# Patient Record
Sex: Male | Born: 1970 | Race: White | Hispanic: No | Marital: Married | Smoking: Never smoker
Health system: Southern US, Community
[De-identification: ages and names within clinical notes are randomized; demographics above are authoritative.]

## PROBLEM LIST (undated history)

## (undated) DIAGNOSIS — Z87442 Personal history of urinary calculi: Secondary | ICD-10-CM

## (undated) DIAGNOSIS — J189 Pneumonia, unspecified organism: Secondary | ICD-10-CM

## (undated) DIAGNOSIS — K219 Gastro-esophageal reflux disease without esophagitis: Secondary | ICD-10-CM

## (undated) DIAGNOSIS — R7303 Prediabetes: Secondary | ICD-10-CM

## (undated) DIAGNOSIS — E119 Type 2 diabetes mellitus without complications: Secondary | ICD-10-CM

## (undated) DIAGNOSIS — Z8701 Personal history of pneumonia (recurrent): Secondary | ICD-10-CM

## (undated) DIAGNOSIS — M109 Gout, unspecified: Secondary | ICD-10-CM

## (undated) DIAGNOSIS — I10 Essential (primary) hypertension: Secondary | ICD-10-CM

## (undated) DIAGNOSIS — G4730 Sleep apnea, unspecified: Secondary | ICD-10-CM

## (undated) DIAGNOSIS — I480 Paroxysmal atrial fibrillation: Secondary | ICD-10-CM

## (undated) DIAGNOSIS — M1990 Unspecified osteoarthritis, unspecified site: Secondary | ICD-10-CM

## (undated) DIAGNOSIS — I499 Cardiac arrhythmia, unspecified: Secondary | ICD-10-CM

## (undated) HISTORY — PX: STONE EXTRACTION WITH BASKET: SHX5318

## (undated) HISTORY — DX: Type 2 diabetes mellitus without complications: E11.9

## (undated) HISTORY — PX: KNEE SURGERY: SHX244

## (undated) HISTORY — DX: Personal history of pneumonia (recurrent): Z87.01

## (undated) HISTORY — DX: Paroxysmal atrial fibrillation: I48.0

---

## 2005-06-23 ENCOUNTER — Encounter (HOSPITAL_COMMUNITY): Admission: RE | Admit: 2005-06-23 | Discharge: 2005-07-23 | Payer: Self-pay | Admitting: Internal Medicine

## 2010-05-01 ENCOUNTER — Encounter: Payer: Self-pay | Admitting: Internal Medicine

## 2011-10-06 ENCOUNTER — Other Ambulatory Visit: Payer: Self-pay | Admitting: Urology

## 2011-10-19 ENCOUNTER — Encounter (HOSPITAL_COMMUNITY): Admission: RE | Payer: Self-pay | Source: Ambulatory Visit

## 2011-10-19 ENCOUNTER — Ambulatory Visit (HOSPITAL_COMMUNITY): Admission: RE | Admit: 2011-10-19 | Payer: Self-pay | Source: Ambulatory Visit | Admitting: Urology

## 2011-10-19 SURGERY — LITHOTRIPSY, ESWL
Anesthesia: LOCAL | Laterality: Right

## 2015-03-30 ENCOUNTER — Ambulatory Visit: Payer: Self-pay | Admitting: Ophthalmology

## 2015-03-30 ENCOUNTER — Encounter (HOSPITAL_BASED_OUTPATIENT_CLINIC_OR_DEPARTMENT_OTHER): Payer: Self-pay | Admitting: *Deleted

## 2015-03-30 NOTE — H&P (Signed)
  Date of examination:  03-17-15  Indication for surgery: to straighten the eyes and relieve double vision  Pertinent past medical history:  Past Medical History  Diagnosis Date  . Gout   . 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  +DM.  Ach receptor ab's neg  Pertinent family history: No family history on file.  General:  Healthy appearing patient in no distress.    Eyes:    Acuity [place] cc OD 20/20  OS 20/40  External: Within normal limits x  L tilt  Anterior segment: Within normal limits     Motility:   L HoT 20, (25 by Maddox rod) X(T)6 + "V".  5+ RIO, 3- RSO, L Ho greater in L gaze and R tilt.  DMR 5 deg excyclo  Fundus: Normal   (including discs)  Refraction:  Manifest  OD -5 SE approx  OS -8 SE approx  Heart: Regular rate and rhythm without murmur     Lungs: Clear to auscultation     Abdomen: Soft, nontender, normal bowel sounds     Impression:Right superior oblique palsy, recently acquired,? 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

## 2015-04-02 ENCOUNTER — Ambulatory Visit (HOSPITAL_BASED_OUTPATIENT_CLINIC_OR_DEPARTMENT_OTHER): Payer: Managed Care, Other (non HMO) | Admitting: Certified Registered"

## 2015-04-02 ENCOUNTER — Encounter (HOSPITAL_BASED_OUTPATIENT_CLINIC_OR_DEPARTMENT_OTHER)
Admission: RE | Disposition: A | Discharge status: Home / Self Care | Payer: Self-pay | Source: Ambulatory Visit | Attending: Ophthalmology

## 2015-04-02 ENCOUNTER — Encounter (HOSPITAL_BASED_OUTPATIENT_CLINIC_OR_DEPARTMENT_OTHER): Payer: Self-pay | Admitting: Certified Registered"

## 2015-04-02 ENCOUNTER — Ambulatory Visit (HOSPITAL_BASED_OUTPATIENT_CLINIC_OR_DEPARTMENT_OTHER)
Admission: RE | Admit: 2015-04-02 | Discharge: 2015-04-02 | Disposition: A | Discharge status: Home / Self Care | Payer: Managed Care, Other (non HMO) | Source: Ambulatory Visit | Attending: Ophthalmology | Admitting: Ophthalmology

## 2015-04-02 DIAGNOSIS — M1990 Unspecified osteoarthritis, unspecified site: Secondary | ICD-10-CM | POA: Diagnosis not present

## 2015-04-02 DIAGNOSIS — M109 Gout, unspecified: Secondary | ICD-10-CM | POA: Diagnosis not present

## 2015-04-02 DIAGNOSIS — Z6841 Body Mass Index (BMI) 40.0 and over, adult: Secondary | ICD-10-CM | POA: Insufficient documentation

## 2015-04-02 DIAGNOSIS — H4911 Fourth [trochlear] nerve palsy, right eye: Secondary | ICD-10-CM | POA: Diagnosis not present

## 2015-04-02 HISTORY — DX: Unspecified osteoarthritis, unspecified site: M19.90

## 2015-04-02 HISTORY — PX: STRABISMUS SURGERY: SHX218

## 2015-04-02 HISTORY — DX: Gout, unspecified: M10.9

## 2015-04-02 SURGERY — REPAIR STRABISMUS
Anesthesia: General | Site: Eye | Laterality: Right

## 2015-04-02 MED ORDER — PROPOFOL 10 MG/ML IV BOLUS
INTRAVENOUS | Status: DC | PRN
  Administered 2015-04-02: 350 mg via INTRAVENOUS

## 2015-04-02 MED ORDER — OXYCODONE HCL 5 MG PO TABS
5.0000 mg | ORAL_TABLET | Freq: Once | ORAL | Status: AC | PRN
  Administered 2015-04-02: 5 mg via ORAL

## 2015-04-02 MED ORDER — OXYCODONE HCL 5 MG PO TABS
ORAL_TABLET | ORAL | Status: AC
  Filled 2015-04-02: qty 1

## 2015-04-02 MED ORDER — HYDROMORPHONE HCL 1 MG/ML IJ SOLN
0.2500 mg | INTRAMUSCULAR | Status: DC | PRN
  Administered 2015-04-02 (×2): 0.5 mg via INTRAVENOUS

## 2015-04-02 MED ORDER — MIDAZOLAM HCL 2 MG/2ML IJ SOLN
INTRAMUSCULAR | Status: AC
  Filled 2015-04-02: qty 2

## 2015-04-02 MED ORDER — LIDOCAINE HCL (CARDIAC) 20 MG/ML IV SOLN
INTRAVENOUS | Status: DC | PRN
  Administered 2015-04-02: 100 mg via INTRAVENOUS

## 2015-04-02 MED ORDER — BSS IO SOLN
INTRAOCULAR | Status: DC | PRN
  Administered 2015-04-02: 15 mL via INTRAOCULAR

## 2015-04-02 MED ORDER — KETOROLAC TROMETHAMINE 30 MG/ML IJ SOLN
INTRAMUSCULAR | Status: AC
  Filled 2015-04-02: qty 1

## 2015-04-02 MED ORDER — MEPERIDINE HCL 25 MG/ML IJ SOLN: 6.2500 mg | INTRAMUSCULAR | Status: DC | PRN

## 2015-04-02 MED ORDER — HYDROMORPHONE HCL 1 MG/ML IJ SOLN
INTRAMUSCULAR | Status: AC
  Filled 2015-04-02: qty 1

## 2015-04-02 MED ORDER — SCOPOLAMINE 1 MG/3DAYS TD PT72: 1.0000 | MEDICATED_PATCH | Freq: Once | TRANSDERMAL | Status: DC | PRN

## 2015-04-02 MED ORDER — FENTANYL CITRATE (PF) 100 MCG/2ML IJ SOLN
INTRAMUSCULAR | Status: AC
  Filled 2015-04-02: qty 2

## 2015-04-02 MED ORDER — MIDAZOLAM HCL 2 MG/2ML IJ SOLN
1.0000 mg | INTRAMUSCULAR | Status: DC | PRN
  Administered 2015-04-02: 2 mg via INTRAVENOUS

## 2015-04-02 MED ORDER — DEXAMETHASONE SODIUM PHOSPHATE 4 MG/ML IJ SOLN
INTRAMUSCULAR | Status: DC | PRN
  Administered 2015-04-02: 10 mg via INTRAVENOUS

## 2015-04-02 MED ORDER — BSS IO SOLN
INTRAOCULAR | Status: AC
  Filled 2015-04-02: qty 15

## 2015-04-02 MED ORDER — OXYCODONE HCL 5 MG PO TABS: 5.0000 mg | ORAL_TABLET | Freq: Once | ORAL | Status: DC | PRN

## 2015-04-02 MED ORDER — LACTATED RINGERS IV SOLN
INTRAVENOUS | Status: DC
  Administered 2015-04-02 (×2): via INTRAVENOUS

## 2015-04-02 MED ORDER — PROPOFOL 10 MG/ML IV BOLUS
INTRAVENOUS | Status: AC
  Filled 2015-04-02: qty 20

## 2015-04-02 MED ORDER — TOBRAMYCIN-DEXAMETHASONE 0.3-0.1 % OP OINT
TOPICAL_OINTMENT | OPHTHALMIC | Status: AC
  Filled 2015-04-02: qty 3.5

## 2015-04-02 MED ORDER — ONDANSETRON HCL 4 MG/2ML IJ SOLN
INTRAMUSCULAR | Status: AC
  Filled 2015-04-02: qty 2

## 2015-04-02 MED ORDER — GLYCOPYRROLATE 0.2 MG/ML IJ SOLN
INTRAMUSCULAR | Status: AC
  Filled 2015-04-02: qty 1

## 2015-04-02 MED ORDER — OXYCODONE HCL 5 MG/5ML PO SOLN: 5.0000 mg | Freq: Once | ORAL | Status: DC | PRN

## 2015-04-02 MED ORDER — DEXAMETHASONE SODIUM PHOSPHATE 10 MG/ML IJ SOLN
INTRAMUSCULAR | Status: AC
  Filled 2015-04-02: qty 1

## 2015-04-02 MED ORDER — OXYCODONE HCL 5 MG/5ML PO SOLN: 5.0000 mg | Freq: Once | ORAL | Status: AC | PRN

## 2015-04-02 MED ORDER — GLYCOPYRROLATE 0.2 MG/ML IJ SOLN
0.2000 mg | Freq: Once | INTRAMUSCULAR | Status: AC | PRN
  Administered 2015-04-02: 0.2 mg via INTRAVENOUS

## 2015-04-02 MED ORDER — LIDOCAINE HCL (CARDIAC) 20 MG/ML IV SOLN
INTRAVENOUS | Status: AC
  Filled 2015-04-02: qty 5

## 2015-04-02 MED ORDER — FENTANYL CITRATE (PF) 100 MCG/2ML IJ SOLN
50.0000 ug | INTRAMUSCULAR | Status: AC | PRN
  Administered 2015-04-02: 50 ug via INTRAVENOUS
  Administered 2015-04-02: 100 ug via INTRAVENOUS
  Administered 2015-04-02: 50 ug via INTRAVENOUS

## 2015-04-02 MED ORDER — HYDROMORPHONE HCL 1 MG/ML IJ SOLN: 0.2500 mg | INTRAMUSCULAR | Status: DC | PRN

## 2015-04-02 MED ORDER — KETOROLAC TROMETHAMINE 30 MG/ML IJ SOLN
INTRAMUSCULAR | Status: DC | PRN
  Administered 2015-04-02: 30 mg via INTRAVENOUS

## 2015-04-02 SURGICAL SUPPLY — 31 items
APPLICATOR COTTON TIP 6IN STRL (MISCELLANEOUS) ×8 IMPLANT
APPLICATOR DR MATTHEWS STRL (MISCELLANEOUS) ×2 IMPLANT
BANDAGE EYE OVAL (MISCELLANEOUS) IMPLANT
CAUTERY EYE LOW TEMP 1300F FIN (OPHTHALMIC RELATED) IMPLANT
COVER BACK TABLE 60X90IN (DRAPES) ×2 IMPLANT
COVER MAYO STAND STRL (DRAPES) ×2 IMPLANT
DRAPE SURG 17X23 STRL (DRAPES) ×4 IMPLANT
DRAPE U-SHAPE 76X120 STRL (DRAPES) ×2 IMPLANT
GLOVE BIO SURGEON STRL SZ 6.5 (GLOVE) ×2 IMPLANT
GLOVE BIO SURGEON STRL SZ7.5 (GLOVE) ×2 IMPLANT
GLOVE BIOGEL M STRL SZ7.5 (GLOVE) ×4 IMPLANT
GOWN STRL REUS W/ TWL LRG LVL3 (GOWN DISPOSABLE) ×1 IMPLANT
GOWN STRL REUS W/ TWL XL LVL3 (GOWN DISPOSABLE) ×1 IMPLANT
GOWN STRL REUS W/TWL LRG LVL3 (GOWN DISPOSABLE) ×1
GOWN STRL REUS W/TWL XL LVL3 (GOWN DISPOSABLE) ×3 IMPLANT
NS IRRIG 1000ML POUR BTL (IV SOLUTION) ×2 IMPLANT
PACK BASIN DAY SURGERY FS (CUSTOM PROCEDURE TRAY) ×2 IMPLANT
SHEET MEDIUM DRAPE 40X70 STRL (DRAPES) IMPLANT
SLEEVE SCD COMPRESS KNEE MED (MISCELLANEOUS) ×2 IMPLANT
SPEAR EYE SURG WECK-CEL (MISCELLANEOUS) ×4 IMPLANT
STRIP CLOSURE SKIN 1/4X4 (GAUZE/BANDAGES/DRESSINGS) IMPLANT
SUT 6 0 SILK T G140 8DA (SUTURE) IMPLANT
SUT MERSILENE 5-0 (SUTURE) IMPLANT
SUT PLAIN 6 0 TG1408 (SUTURE) ×2 IMPLANT
SUT SILK 4 0 C 3 735G (SUTURE) ×2 IMPLANT
SUT VICRYL 6 0 S 28 (SUTURE) ×2 IMPLANT
SUT VICRYL ABS 6-0 S29 18IN (SUTURE) IMPLANT
SYR TB 1ML LL NO SAFETY (SYRINGE) ×2 IMPLANT
SYRINGE 10CC LL (SYRINGE) ×2 IMPLANT
TOWEL OR 17X24 6PK STRL BLUE (TOWEL DISPOSABLE) ×2 IMPLANT
TRAY DSU PREP LF (CUSTOM PROCEDURE TRAY) ×2 IMPLANT

## 2015-04-02 NOTE — Discharge Instructions (Signed)
Diet: Clear liquids, advance to soft foods then regular diet as tolerated. ° °Pain control:  ° 1)  Ibuprofen 600 mg by mouth every 6-8 hours as needed for pain ° 2)  Ice pack/cold compress to operated eye(s) ad desired ° °Eye medications:  none ° °Activity: No swimming for 1 week.  It is OK to let water run over the face and eyes while showering or taking a bath, even during the first week.  No other restriction on exercise or activity. ° °Call Dr. Young's office [phone] with any problems or concerns. °Post Anesthesia Home Care Instructions ° °Activity: °Get plenty of rest for the remainder of the day. A responsible adult should stay with you for 24 hours following the procedure.  °For the next 24 hours, DO NOT: °-Drive a car °-Operate machinery °-Drink alcoholic beverages °-Take any medication unless instructed by your physician °-Make any legal decisions or sign important papers. ° °Meals: °Start with liquid foods such as gelatin or soup. Progress to regular foods as tolerated. Avoid greasy, spicy, heavy foods. If nausea and/or vomiting occur, drink only clear liquids until the nausea and/or vomiting subsides. Call your physician if vomiting continues. ° °Special Instructions/Symptoms: °Your throat may feel dry or sore from the anesthesia or the breathing tube placed in your throat during surgery. If this causes discomfort, gargle with warm salt water. The discomfort should disappear within 24 hours. ° °If you had a scopolamine patch placed behind your ear for the management of post- operative nausea and/or vomiting: ° °1. The medication in the patch is effective for 72 hours, after which it should be removed.  Wrap patch in a tissue and discard in the trash. Wash hands thoroughly with soap and water. °2. You may remove the patch earlier than 72 hours if you experience unpleasant side effects which may include dry mouth, dizziness or visual disturbances. °3. Avoid touching the patch. Wash your hands with soap  and water after contact with the patch. °  ° °

## 2015-04-02 NOTE — H&P (View-Only) (Signed)
  Date of examination:  03-17-15  Indication for surgery: to straighten the eyes and relieve double vision  Pertinent past medical history:  Past Medical History  Diagnosis Date  . Gout   . 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  +DM.  Ach receptor ab's neg  Pertinent family history: No family history on file.  General:  Healthy appearing patient in no distress.    Eyes:    Acuity [place] cc OD 20/20  OS 20/40  External: Within normal limits x  L tilt  Anterior segment: Within normal limits     Motility:   L HoT 20, (25 by Maddox rod) X(T)6 + "V".  5+ RIO, 3- RSO, L Ho greater in L gaze and R tilt.  DMR 5 deg excyclo  Fundus: Normal   (including discs)  Refraction:  Manifest  OD -5 SE approx  OS -8 SE approx  Heart: Regular rate and rhythm without murmur     Lungs: Clear to auscultation     Abdomen: Soft, nontender, normal bowel sounds     Impression:Right superior oblique palsy, recently acquired,? Microvascular in this pt with DM  Plan: RIO recess  Clotiel Troop O 

## 2015-04-02 NOTE — Transfer of Care (Signed)
Immediate Anesthesia Transfer of Care Note  Patient: [name] [name]  Procedure(s) Performed: Procedure(s): REPAIR STRABISMUS RIGHT EYE (Right)  Patient Location: PACU  Anesthesia Type:General  Level of Consciousness: awake, alert  and oriented  Airway & Oxygen Therapy: Patient Spontanous Breathing and Patient connected to face mask oxygen  Post-op Assessment: Report given to RN, Post -op Vital signs reviewed and stable and Patient moving all extremities  Post vital signs: Reviewed and stable  Last Vitals:  Filed Vitals:   04/02/15 0902 04/02/15 1131  BP: 150/86   Pulse: 61 97  Temp: 36.8 C   Resp: 20     Complications: No apparent anesthesia complications

## 2015-04-02 NOTE — Anesthesia Postprocedure Evaluation (Signed)
Anesthesia Post Note  Patient: [name] [name]  Procedure(s) Performed: Procedure(s) (LRB): REPAIR STRABISMUS RIGHT EYE (Right)  Patient location during evaluation: PACU Anesthesia Type: General Level of consciousness: awake and alert Pain management: pain level controlled Vital Signs Assessment: post-procedure vital signs reviewed and stable Respiratory status: spontaneous breathing, nonlabored ventilation and respiratory function stable Cardiovascular status: blood pressure returned to baseline and stable Postop Assessment: no signs of nausea or vomiting Anesthetic complications: no    Last Vitals:  Filed Vitals:   04/02/15 1215 04/02/15 1224  BP:  120/76  Pulse: 75 58  Temp:  36.8 C  Resp: 13 20    Last Pain:  Filed Vitals:   04/02/15 1225  PainSc: 3                  Ed Mandich A

## 2015-04-02 NOTE — Interval H&P Note (Signed)
History and Physical Interval Note:  04/02/2015 10:29 AM  [name] [name]  has presented today for surgery, with the diagnosis of RIGHT 4TH NERVE PALSY  The various methods of treatment have been discussed with the patient and family. After consideration of risks, benefits and other options for treatment, the patient has consented to  Procedure(s): REPAIR STRABISMUS RIGHT EYE (Right) as a surgical intervention .  The patient's history has been reviewed, patient examined, no change in status, stable for surgery.  I have reviewed the patient's chart and labs.  Questions were answered to the patient's satisfaction.     Shara BlazingYOUNG,Alphonzo Devera O

## 2015-04-02 NOTE — Anesthesia Preprocedure Evaluation (Addendum)
Anesthesia Evaluation  Patient identified by MRN, date of birth, ID band Patient awake and Patient unresponsive    Airway Mallampati: I  TM Distance: >3 FB Neck ROM: Full    Dental  (+) Teeth Intact   Pulmonary    breath sounds clear to auscultation       Cardiovascular  Rhythm:Regular Rate:Normal     Neuro/Psych    GI/Hepatic   Endo/Other  Morbid obesity  Renal/GU      Musculoskeletal   Abdominal   Peds  Hematology   Anesthesia Other Findings   Reproductive/Obstetrics                            Anesthesia Physical Anesthesia Plan  ASA: II  Anesthesia Plan: General   Post-op Pain Management:    Induction: Intravenous  Airway Management Planned: LMA  Additional Equipment:   Intra-op Plan:   Post-operative Plan: Extubation in OR  Informed Consent: I have reviewed the patients History and Physical, chart, labs and discussed the procedure including the risks, benefits and alternatives for the proposed anesthesia with the patient or authorized representative who has indicated his/her understanding and acceptance.   Dental advisory given  Plan Discussed with: CRNA, Anesthesiologist and Surgeon  Anesthesia Plan Comments:         Anesthesia Quick Evaluation

## 2015-04-02 NOTE — Anesthesia Procedure Notes (Signed)
Procedure Name: LMA Insertion Date/Time: 04/02/2015 10:51 AM Performed by: Curly ShoresRAFT, Arrin Pintor W Pre-anesthesia Checklist: Patient identified, Emergency Drugs available, Suction available and Patient being monitored Patient Re-evaluated:Patient Re-evaluated prior to inductionOxygen Delivery Method: Circle System Utilized Preoxygenation: Pre-oxygenation with 100% oxygen Intubation Type: IV induction Ventilation: Mask ventilation without difficulty LMA: LMA inserted LMA Size: 5.0 Number of attempts: 1 Airway Equipment and Method: Bite block Placement Confirmation: positive ETCO2 and breath sounds checked- equal and bilateral Tube secured with: Tape Dental Injury: Teeth and Oropharynx as per pre-operative assessment

## 2015-04-02 NOTE — Op Note (Signed)
04/02/2015  11:33 AM  PATIENT:  [name] [name]  44 y.o. male  PRE-OPERATIVE DIAGNOSIS: Right superior oblique palsy  POST-OPERATIVE DIAGNOSIS: same  PROCEDURE:  Inferior oblique recession right eye(s)  SURGEON:  Pasty SpillersWilliam O.Maple HudsonYoung, M.D.   ANESTHESIA:   general  COMPLICATIONS:None  DESCRIPTION OF PROCEDURE: The patient was taken to the operating room where He was identified by me. General anesthesia was induced without difficulty after placement of appropriate monitors. The patient was prepped and draped in standard sterile fashion. A lid speculum was placed in the left eye.  Through an inferotemporal fornix incision through conjunctiva and Tenon fascia, the left lateral rectus muscle was engaged on a series of muscle hooks and ultimately on a Gass hook, which was used to draw a traction suture of 4-0 silk under the muscle. This was used to draw the eye up and in. Using 2 muscle hooks through the conjunctival incision for exposure, the left inferior oblique muscle was identified and engaged on oblique hook. The muscle was drawn forward and cleared of its fascial attachments all the way to its insertion, which was secured with a fine curved hemostat. The muscle was disinserted. The cut end was secured with a double-armed 6-0 Vicryl suture, with a double locking bite at each border of the muscle. The left inferior rectus muscle was engaged on a series of muscle hooks. A mark was made on sclera 3 mm posterior and 3 mm temporal to the temporal border of the inferior rectus insertion, and this was used as the exit point for the pole sutures of the inferior oblique, which were passed in crossed swords fashion and tied securely. The traction suture was removed. The conjunctival incision was closed with 1 6-0 plain gut suture.  TobraDex ointment was placed in  the left eye(s). The patient was awakened without difficulty and taken to the recovery room in stable condition, having suffered no intraoperative  or immediate postoperative complications.  Pasty SpillersWilliam O. Feather Berrie M.D.    PATIENT DISPOSITION:  PACU - hemodynamically stable.

## 2015-04-06 ENCOUNTER — Encounter (HOSPITAL_BASED_OUTPATIENT_CLINIC_OR_DEPARTMENT_OTHER): Payer: Self-pay | Admitting: Ophthalmology

## 2016-08-25 ENCOUNTER — Encounter (HOSPITAL_COMMUNITY): Payer: Self-pay | Admitting: *Deleted

## 2016-08-25 ENCOUNTER — Ambulatory Visit (INDEPENDENT_AMBULATORY_CARE_PROVIDER_SITE_OTHER): Payer: Managed Care, Other (non HMO) | Admitting: Urology

## 2016-08-25 ENCOUNTER — Other Ambulatory Visit: Payer: Self-pay | Admitting: Urology

## 2016-08-25 ENCOUNTER — Ambulatory Visit (HOSPITAL_COMMUNITY)
Admission: RE | Admit: 2016-08-25 | Discharge: 2016-08-25 | Disposition: A | Discharge status: Home / Self Care | Payer: Managed Care, Other (non HMO) | Source: Ambulatory Visit | Attending: Urology | Admitting: Urology

## 2016-08-25 DIAGNOSIS — N200 Calculus of kidney: Secondary | ICD-10-CM

## 2016-08-25 DIAGNOSIS — N201 Calculus of ureter: Secondary | ICD-10-CM | POA: Diagnosis not present

## 2016-08-28 NOTE — H&P (Signed)
CC: I have ureteral stone.  HPI: [name] [name] is a 46 year-old male patient who is here for ureteral stone.  The problem is on the left side. He first stated noticing pain on 08/24/2016. This is not his first kidney stone. He is currently having flank pain and nausea. He denies having back pain, groin pain, vomiting, fever, and chills. Pain is occuring on the left side. He has not caught a stone in his urine strainer since his symptoms began.   He has had ureteroscopy for treatment of his stones in the past.   [name] [name] is a 46 yo WM with a history of stones who had the onset last night of left flank pain that was severe. He had nausea and some microhematuria in the ER. A CT showed a 5 x 11 left proximal stone with mild obstruction. He has had no fever or chills. He got relief with toradol but his pain is back. He has had prior ureteroscopy remotely.      ALLERGIES: Penicillins    MEDICATIONS: Allopurinol 300 mg tablet  Naproxen 500 mg tablet     GU PSH: Ureteroscopic stone removal - 2013      PSH Notes: Cystoscopy With Ureteroscopy With Removal Of Calculus, Knee Surgery, Knee Surgery   NON-GU PSH: None   GU PMH: Renal calculus, Bilateral kidney stones - 2014 Renal cyst, Renal cyst, acquired, left - 2014 Ureteral calculus, Calculus of right ureter - 2014      PMH Notes:  1898-04-10 00:00:00 - Note: Normal Routine History And Physical Adult   NON-GU PMH: Personal history of other specified conditions, History of heartburn - 2014 Gout    FAMILY HISTORY: Family Health Status Number - Runs In Family Family Health Status Of Father - Alive - Runs In [facility] Family Health Status Of Mother - Alive 76 - Runs In Family Hypertension - Father renal cancer - Father   SOCIAL HISTORY: Marital Status: Married Current Smoking Status: Patient has never smoked.   Tobacco Use Assessment Completed: Used Tobacco in last 30 days? Has never drank.  Drinks 4+ caffeinated drinks per  day. Patient's occupation Production manager.     Notes: Marital History - Currently Married, Caffeine Use, Never A Smoker, Alcohol Use   REVIEW OF SYSTEMS:    GU Review Male:   Patient denies frequent urination, hard to postpone urination, burning/ pain with urination, get up at night to urinate, leakage of urine, stream starts and stops, trouble starting your stream, have to strain to urinate , erection problems, and penile pain.  Gastrointestinal (Upper):   Patient reports nausea. Patient denies vomiting and indigestion/ heartburn.  Gastrointestinal (Lower):   Patient denies diarrhea and constipation.  Constitutional:   Patient denies fever, weight loss, fatigue, and night sweats.  Skin:   Patient denies skin rash/ lesion and itching.  Eyes:   Patient denies blurred vision and double vision.  Ears/ Nose/ Throat:   Patient denies sore throat and sinus problems.  Hematologic/Lymphatic:   Patient denies swollen glands and easy bruising.  Cardiovascular:   Patient denies leg swelling and chest pains.  Respiratory:   Patient denies cough and shortness of breath.  Endocrine:   Patient denies excessive thirst.  Musculoskeletal:   Patient reports joint pain. Patient denies back pain.  Neurological:   Patient denies headaches and dizziness.  Psychologic:   Patient denies depression and anxiety.   VITAL SIGNS:      08/25/2016 09:35 AM  Weight 360 lb / 163.29 kg  Height 77 in / 195.58 cm  BP 168/91 mmHg  Pulse 64 /min  Temperature 98.0 F / 37 C  BMI 42.7 kg/m   MULTI-SYSTEM PHYSICAL EXAMINATION:    Constitutional: Obese. No physical deformities. Normally developed. Good grooming.   Neck: Neck symmetrical, not swollen. Normal tracheal position.  Respiratory: No labored breathing, no use of accessory muscles. CTA  Cardiovascular: Normal temperature,RRR without murmur, no swelling, no varicosities.   Lymphatic: No enlargement, no tenderness of axillae, supraclavicular or neck lymph nodes.   Skin: No paleness, no jaundice, no cyanosis. No lesion, no ulcer, no rash.  Neurologic / Psychiatric: Oriented to time, oriented to place, oriented to person. No depression, no anxiety, no agitation.  Gastrointestinal: Obese abdomen. No hernia. No mass, no tenderness, no rigidity.   Musculoskeletal: Normal gait and station of head and neck.     PAST DATA REVIEWED:  Source Of History:  Patient  Urine Test Review:   Urinalysis  X-Ray Review: KUB: Reviewed Films. Reviewed Report. Discussed With Patient. KUB shows bilateral renal stones with a 12x415mm left proximal stone but also similar sized right distal stones which were present on CT but not obstructing or commented on.  C.T. Stone Protocol: Reviewed Films. Reviewed Report. Discussed With Patient.     PROCEDURES:          Urinalysis - 81003 Dipstick Dipstick Cont'd  Specimen: Voided Bilirubin: Neg  Color: Yellow Ketones: Neg  Appearance: Clear Blood: 3+  Specific Gravity: 1.020 Protein: Trace  pH: 6.0 Urobilinogen: 0.2  Glucose: Neg Nitrites: Neg    Leukocyte Esterase: Trace    ASSESSMENT:      ICD-10 Details  1 GU:   Ureteral calculus - N20.1 He has a left proximal and right distal stones with left hydro and pain. I discussed the treatment options and with the bilateral stones, I am going to get him set up for bilateral ureteroscopy next week. I have reviewed the risks of bleeding, infection, ureteral injury, need for a stent or secondary procedures, thrombotic events and anesthetic complications.   2   Renal calculus - N20.0 He has small bilateral renal stones.    PLAN:           Orders X-Rays: KUB          Schedule Return Visit/Planned Activity: ASAP - Schedule Surgery

## 2016-08-29 ENCOUNTER — Ambulatory Visit (HOSPITAL_COMMUNITY): Payer: Managed Care, Other (non HMO)

## 2016-08-29 ENCOUNTER — Encounter (HOSPITAL_COMMUNITY): Payer: Self-pay | Admitting: *Deleted

## 2016-08-29 ENCOUNTER — Ambulatory Visit (HOSPITAL_COMMUNITY): Payer: Managed Care, Other (non HMO) | Admitting: Anesthesiology

## 2016-08-29 ENCOUNTER — Encounter (HOSPITAL_COMMUNITY)
Admission: RE | Disposition: A | Discharge status: Home / Self Care | Payer: Self-pay | Source: Ambulatory Visit | Attending: Urology

## 2016-08-29 ENCOUNTER — Ambulatory Visit (HOSPITAL_COMMUNITY)
Admission: RE | Admit: 2016-08-29 | Discharge: 2016-08-29 | Disposition: A | Discharge status: Home / Self Care | Payer: Managed Care, Other (non HMO) | Source: Ambulatory Visit | Attending: Urology | Admitting: Urology

## 2016-08-29 DIAGNOSIS — Z6841 Body Mass Index (BMI) 40.0 and over, adult: Secondary | ICD-10-CM | POA: Diagnosis not present

## 2016-08-29 DIAGNOSIS — Z791 Long term (current) use of non-steroidal anti-inflammatories (NSAID): Secondary | ICD-10-CM | POA: Diagnosis not present

## 2016-08-29 DIAGNOSIS — Z419 Encounter for procedure for purposes other than remedying health state, unspecified: Secondary | ICD-10-CM

## 2016-08-29 DIAGNOSIS — N23 Unspecified renal colic: Secondary | ICD-10-CM

## 2016-08-29 DIAGNOSIS — N201 Calculus of ureter: Secondary | ICD-10-CM | POA: Diagnosis present

## 2016-08-29 DIAGNOSIS — N132 Hydronephrosis with renal and ureteral calculous obstruction: Secondary | ICD-10-CM | POA: Diagnosis not present

## 2016-08-29 HISTORY — DX: Pneumonia, unspecified organism: J18.9

## 2016-08-29 HISTORY — DX: Prediabetes: R73.03

## 2016-08-29 HISTORY — PX: CYSTOSCOPY/RETROGRADE/URETEROSCOPY/STONE EXTRACTION WITH BASKET: SHX5317

## 2016-08-29 HISTORY — DX: Personal history of urinary calculi: Z87.442

## 2016-08-29 LAB — BASIC METABOLIC PANEL
ANION GAP: 12 (ref 5–15)
BUN: 18 mg/dL (ref 6–20)
CHLORIDE: 101 mmol/L (ref 101–111)
CO2: 25 mmol/L (ref 22–32)
CREATININE: 1.43 mg/dL — AB (ref 0.61–1.24)
Calcium: 9.6 mg/dL (ref 8.9–10.3)
GFR calc non Af Amer: 58 mL/min — ABNORMAL LOW (ref >60)
Glucose, Bld: 149 mg/dL — ABNORMAL HIGH (ref 65–99)
Potassium: 4.5 mmol/L (ref 3.5–5.1)
SODIUM: 138 mmol/L (ref 135–145)

## 2016-08-29 LAB — CBC
HCT: 45 % (ref 39.0–52.0)
HEMOGLOBIN: 15.9 g/dL (ref 13.0–17.0)
MCH: 29.7 pg (ref 26.0–34.0)
MCHC: 35.3 g/dL (ref 30.0–36.0)
MCV: 84.1 fL (ref 78.0–100.0)
Platelets: 271 10*3/uL (ref 150–400)
RBC: 5.35 MIL/uL (ref 4.22–5.81)
RDW: 12.4 % (ref 11.5–15.5)
WBC: 7.5 10*3/uL (ref 4.0–10.5)

## 2016-08-29 SURGERY — CYSTOSCOPY, WITH CALCULUS REMOVAL USING BASKET
Anesthesia: General | Laterality: Bilateral

## 2016-08-29 MED ORDER — SODIUM CHLORIDE 0.9% FLUSH: 3.0000 mL | Freq: Two times a day (BID) | INTRAVENOUS | Status: DC

## 2016-08-29 MED ORDER — ONDANSETRON HCL 4 MG/2ML IJ SOLN
INTRAMUSCULAR | Status: AC
  Filled 2016-08-29: qty 2

## 2016-08-29 MED ORDER — PROPOFOL 10 MG/ML IV BOLUS
INTRAVENOUS | Status: DC | PRN
  Administered 2016-08-29: 250 mg via INTRAVENOUS

## 2016-08-29 MED ORDER — HYDROMORPHONE HCL 1 MG/ML IJ SOLN
INTRAMUSCULAR | Status: AC
  Administered 2016-08-29: 0.5 mg via INTRAVENOUS
  Filled 2016-08-29: qty 1

## 2016-08-29 MED ORDER — MIDAZOLAM HCL 2 MG/2ML IJ SOLN
INTRAMUSCULAR | Status: AC
  Filled 2016-08-29: qty 2

## 2016-08-29 MED ORDER — FENTANYL CITRATE (PF) 100 MCG/2ML IJ SOLN
INTRAMUSCULAR | Status: DC | PRN
  Administered 2016-08-29 (×3): 50 ug via INTRAVENOUS

## 2016-08-29 MED ORDER — PROPOFOL 10 MG/ML IV BOLUS
INTRAVENOUS | Status: AC
  Filled 2016-08-29: qty 40

## 2016-08-29 MED ORDER — ROCURONIUM BROMIDE 50 MG/5ML IV SOSY
PREFILLED_SYRINGE | INTRAVENOUS | Status: AC
  Filled 2016-08-29: qty 5

## 2016-08-29 MED ORDER — ACETAMINOPHEN 650 MG RE SUPP
650.0000 mg | RECTAL | Status: DC | PRN
  Filled 2016-08-29: qty 1

## 2016-08-29 MED ORDER — SODIUM CHLORIDE 0.9 % IV SOLN: 250.0000 mL | INTRAVENOUS | Status: DC | PRN

## 2016-08-29 MED ORDER — FENTANYL CITRATE (PF) 250 MCG/5ML IJ SOLN
INTRAMUSCULAR | Status: AC
  Filled 2016-08-29: qty 5

## 2016-08-29 MED ORDER — SUGAMMADEX SODIUM 200 MG/2ML IV SOLN
INTRAVENOUS | Status: DC | PRN
  Administered 2016-08-29: 200 mg via INTRAVENOUS

## 2016-08-29 MED ORDER — LIDOCAINE 2% (20 MG/ML) 5 ML SYRINGE
INTRAMUSCULAR | Status: DC | PRN
  Administered 2016-08-29: 100 mg via INTRAVENOUS

## 2016-08-29 MED ORDER — SODIUM CHLORIDE 0.9 % IR SOLN
Status: DC | PRN
  Administered 2016-08-29: 3000 mL

## 2016-08-29 MED ORDER — HYDROCODONE-ACETAMINOPHEN 5-325 MG PO TABS: 1.0000 | ORAL_TABLET | Freq: Four times a day (QID) | ORAL | 0 refills | Status: DC | PRN

## 2016-08-29 MED ORDER — SUCCINYLCHOLINE CHLORIDE 200 MG/10ML IV SOSY
PREFILLED_SYRINGE | INTRAVENOUS | Status: AC
  Filled 2016-08-29: qty 10

## 2016-08-29 MED ORDER — HYDROMORPHONE HCL 1 MG/ML IJ SOLN
0.2500 mg | INTRAMUSCULAR | Status: DC | PRN
  Administered 2016-08-29 (×2): 0.5 mg via INTRAVENOUS

## 2016-08-29 MED ORDER — MORPHINE SULFATE (PF) 4 MG/ML IV SOLN: 2.0000 mg | INTRAVENOUS | Status: DC | PRN

## 2016-08-29 MED ORDER — ROCURONIUM BROMIDE 10 MG/ML (PF) SYRINGE
PREFILLED_SYRINGE | INTRAVENOUS | Status: DC | PRN
  Administered 2016-08-29: 20 mg via INTRAVENOUS

## 2016-08-29 MED ORDER — CIPROFLOXACIN IN D5W 400 MG/200ML IV SOLN
400.0000 mg | INTRAVENOUS | Status: AC
  Administered 2016-08-29: 400 mg via INTRAVENOUS
  Filled 2016-08-29: qty 200

## 2016-08-29 MED ORDER — IOHEXOL 300 MG/ML  SOLN
INTRAMUSCULAR | Status: DC | PRN
  Administered 2016-08-29: 4 mL

## 2016-08-29 MED ORDER — ONDANSETRON HCL 4 MG/2ML IJ SOLN
INTRAMUSCULAR | Status: DC | PRN
  Administered 2016-08-29: 4 mg via INTRAVENOUS

## 2016-08-29 MED ORDER — SODIUM CHLORIDE 0.9% FLUSH: 3.0000 mL | INTRAVENOUS | Status: DC | PRN

## 2016-08-29 MED ORDER — MIDAZOLAM HCL 5 MG/5ML IJ SOLN
INTRAMUSCULAR | Status: DC | PRN
  Administered 2016-08-29: 2 mg via INTRAVENOUS

## 2016-08-29 MED ORDER — OXYCODONE HCL 5 MG PO TABS
5.0000 mg | ORAL_TABLET | ORAL | Status: DC | PRN
  Administered 2016-08-29: 5 mg via ORAL
  Filled 2016-08-29: qty 1

## 2016-08-29 MED ORDER — SUCCINYLCHOLINE CHLORIDE 200 MG/10ML IV SOSY
PREFILLED_SYRINGE | INTRAVENOUS | Status: DC | PRN
  Administered 2016-08-29: 100 mg via INTRAVENOUS

## 2016-08-29 MED ORDER — LIDOCAINE 2% (20 MG/ML) 5 ML SYRINGE
INTRAMUSCULAR | Status: AC
  Filled 2016-08-29: qty 5

## 2016-08-29 MED ORDER — PHENAZOPYRIDINE HCL 200 MG PO TABS: 200.0000 mg | ORAL_TABLET | Freq: Three times a day (TID) | ORAL | 0 refills | Status: DC | PRN

## 2016-08-29 MED ORDER — SUGAMMADEX SODIUM 200 MG/2ML IV SOLN
INTRAVENOUS | Status: AC
  Filled 2016-08-29: qty 2

## 2016-08-29 MED ORDER — ACETAMINOPHEN 325 MG PO TABS: 650.0000 mg | ORAL_TABLET | ORAL | Status: DC | PRN

## 2016-08-29 MED ORDER — PROMETHAZINE HCL 25 MG/ML IJ SOLN: 6.2500 mg | INTRAMUSCULAR | Status: DC | PRN

## 2016-08-29 MED ORDER — LACTATED RINGERS IV SOLN
INTRAVENOUS | Status: DC
  Administered 2016-08-29: 11:00:00 via INTRAVENOUS

## 2016-08-29 SURGICAL SUPPLY — 20 items
BAG URO CATCHER STRL LF (MISCELLANEOUS) ×2 IMPLANT
BASKET STONE NCOMPASS (UROLOGICAL SUPPLIES) IMPLANT
CATH URET 5FR 28IN OPEN ENDED (CATHETERS) IMPLANT
CATH URET DUAL LUMEN 6-10FR 50 (CATHETERS) ×2 IMPLANT
CLOTH BEACON ORANGE TIMEOUT ST (SAFETY) ×2 IMPLANT
COVER SURGICAL LIGHT HANDLE (MISCELLANEOUS) ×2 IMPLANT
EXTRACTOR STONE NITINOL NGAGE (UROLOGICAL SUPPLIES) ×4 IMPLANT
FIBER LASER FLEXIVA 1000 (UROLOGICAL SUPPLIES) IMPLANT
FIBER LASER FLEXIVA 365 (UROLOGICAL SUPPLIES) ×2 IMPLANT
FIBER LASER FLEXIVA 550 (UROLOGICAL SUPPLIES) IMPLANT
GLOVE SURG SS PI 8.0 STRL IVOR (GLOVE) IMPLANT
GOWN STRL REUS W/TWL XL LVL3 (GOWN DISPOSABLE) ×2 IMPLANT
GUIDEWIRE STR DUAL SENSOR (WIRE) ×2 IMPLANT
IV NS IRRIG 3000ML ARTHROMATIC (IV SOLUTION) ×2 IMPLANT
MANIFOLD NEPTUNE II (INSTRUMENTS) ×2 IMPLANT
PACK CYSTO (CUSTOM PROCEDURE TRAY) ×2 IMPLANT
SHEATH ACCESS URETERAL 38CM (SHEATH) ×2 IMPLANT
SHEATH URET ACCESS 10/12FR (MISCELLANEOUS) IMPLANT
STENT URET 6FRX26 CONTOUR (STENTS) ×2 IMPLANT
TUBING CONNECTING 10 (TUBING) ×2 IMPLANT

## 2016-08-29 NOTE — Transfer of Care (Signed)
Immediate Anesthesia Transfer of Care Note  Patient: [name] [name]  Procedure(s) Performed: Procedure(s): CYSTOSCOPY/BILATERAL RETEROGRADE BILATERTAL URETEROSCOPY/STONE EXTRACTION WITH BASKET, RIGHT STENT PLACEMENT, HOLIUM LASER (Bilateral)  Patient Location: PACU  Anesthesia Type:General  Level of Consciousness: awake, alert , oriented and patient cooperative  Airway & Oxygen Therapy: Patient Spontanous Breathing and Patient connected to face mask oxygen  Post-op Assessment: Report given to RN, Post -op Vital signs reviewed and stable and Patient moving all extremities  Post vital signs: Reviewed and stable  Last Vitals:  Vitals:   08/29/16 0940 08/29/16 1117  BP: (!) 157/108 (!) 138/94  Pulse: 75   Resp: 18   Temp: 37.2 C     Last Pain:  Vitals:   08/29/16 0940  TempSrc: Oral      Patients Stated Pain Goal: 4 (08/29/16 1147)  Complications: No apparent anesthesia complications

## 2016-08-29 NOTE — Brief Op Note (Signed)
08/29/2016  1:30 PM  PATIENT:  [name] [name]  46 y.o. male  PRE-OPERATIVE DIAGNOSIS:  BILATERAL URETERAL STONES  POST-OPERATIVE DIAGNOSIS:  BILATERAL URETERAL STONES  PROCEDURE:  Procedure(s): CYSTOSCOPY/RIGHT RETEROGRADE, BILATERTAL URETEROSCOPY/LASER/STONE EXTRACTION WITH BASKET, LEFT STENT (Bilateral)  SURGEON:  Surgeon(s) and Role:    Bjorn Pippin* Yaneth Fairbairn, MD - Primary  PHYSICIAN ASSISTANT:   ASSISTANTS: none   ANESTHESIA:   general  EBL:  No intake/output data recorded.  BLOOD ADMINISTERED:none  DRAINS: left 6 x 26 jj stent   LOCAL MEDICATIONS USED:  NONE  SPECIMEN:  Source of Specimen:  bilateral ureteral stones  DISPOSITION OF SPECIMEN:  to patient to bring to office  COUNTS:  YES  TOURNIQUET:  * No tourniquets in log *  DICTATION: .Other Dictation: Dictation Number [id number]  PLAN OF CARE: Discharge to home after PACU  PATIENT DISPOSITION:  PACU - hemodynamically stable.   Delay start of Pharmacological VTE agent (>24hrs) due to surgical blood loss or risk of bleeding: not applicable

## 2016-08-29 NOTE — Interval H&P Note (Signed)
History and Physical Interval Note:  Right distal stones are still present and left proximal stone is now distal.   08/29/2016 12:08 PM  [name] [name]  has presented today for surgery, with the diagnosis of BILATERAL URETERAL STONES  The various methods of treatment have been discussed with the patient and family. After consideration of risks, benefits and other options for treatment, the patient has consented to  Procedure(s): CYSTOSCOPY/BILATERAL RETEROGRADE BILATERTAL URETEROSCOPY/STONE EXTRACTION WITH BASKET POSSIBLE STENT (Bilateral) as a surgical intervention .  The patient's history has been reviewed, patient examined, no change in status, stable for surgery.  I have reviewed the patient's chart and labs.  Questions were answered to the patient's satisfaction.     [name]

## 2016-08-29 NOTE — Anesthesia Preprocedure Evaluation (Addendum)
Anesthesia Evaluation  Patient identified by MRN, date of birth, ID band Patient awake    Reviewed: Allergy & Precautions, NPO status , Patient's Chart, lab work & pertinent test results  Airway Mallampati: II  TM Distance: >3 FB Neck ROM: Full    Dental no notable dental hx.    Pulmonary neg pulmonary ROS,    Pulmonary exam normal breath sounds clear to auscultation       Cardiovascular negative cardio ROS Normal cardiovascular exam Rhythm:Regular Rate:Normal     Neuro/Psych negative neurological ROS  negative psych ROS   GI/Hepatic negative GI ROS, Neg liver ROS,   Endo/Other  Morbid obesity  Renal/GU negative Renal ROS  negative genitourinary   Musculoskeletal negative musculoskeletal ROS (+)   Abdominal   Peds negative pediatric ROS (+)  Hematology negative hematology ROS (+)   Anesthesia Other Findings   Reproductive/Obstetrics negative OB ROS                             Anesthesia Physical Anesthesia Plan  ASA: II  Anesthesia Plan: General   Post-op Pain Management:    Induction: Intravenous  Airway Management Planned: LMA  Additional Equipment:   Intra-op Plan:   Post-operative Plan: Extubation in OR  Informed Consent: I have reviewed the patients History and Physical, chart, labs and discussed the procedure including the risks, benefits and alternatives for the proposed anesthesia with the patient or authorized representative who has indicated his/her understanding and acceptance.   Dental advisory given  Plan Discussed with: CRNA and Surgeon  Anesthesia Plan Comments:         Anesthesia Quick Evaluation  

## 2016-08-29 NOTE — Discharge Instructions (Signed)
General Anesthesia, Adult, Care After °These instructions provide you with information about caring for yourself after your procedure. Your health care provider may also give you more specific instructions. Your treatment has been planned according to current medical practices, but problems sometimes occur. Call your health care provider if you have any problems or questions after your procedure. °What can I expect after the procedure? °After the procedure, it is common to have: °· Vomiting. °· A sore throat. °· Mental slowness. °It is common to feel: °· Nauseous. °· Cold or shivery. °· Sleepy. °· Tired. °· Sore or achy, even in parts of your body where you did not have surgery. °Follow these instructions at home: °For at least 24 hours after the procedure: °· Do not: °¨ Participate in activities where you could fall or become injured. °¨ Drive. °¨ Use heavy machinery. °¨ Drink alcohol. °¨ Take sleeping pills or medicines that cause drowsiness. °¨ Make important decisions or sign legal documents. °¨ Take care of children on your own. °· Rest. °Eating and drinking °· If you vomit, drink water, juice, or soup when you can drink without vomiting. °· Drink enough fluid to keep your urine clear or pale yellow. °· Make sure you have little or no nausea before eating solid foods. °· Follow the diet recommended by your health care provider. °General instructions °· Have a responsible adult stay with you until you are awake and alert. °· Return to your normal activities as told by your health care provider. Ask your health care provider what activities are safe for you. °· Take over-the-counter and prescription medicines only as told by your health care provider. °· If you smoke, do not smoke without supervision. °· Keep all follow-up visits as told by your health care provider. This is important. °Contact a health care provider if: °· You continue to have nausea or vomiting at home, and medicines are not helpful. °· You  cannot drink fluids or start eating again. °· You cannot urinate after 8-12 hours. °· You develop a skin rash. °· You have fever. °· You have increasing redness at the site of your procedure. °Get help right away if: °· You have difficulty breathing. °· You have chest pain. °· You have unexpected bleeding. °· You feel that you are having a life-threatening or urgent problem. °This information is not intended to replace advice given to you by your health care provider. Make sure you discuss any questions you have with your health care provider. °Document Released: 07/03/2000 Document Revised: 08/30/2015 Document Reviewed: 03/11/2015 °Elsevier Interactive Patient Education © 2017 [employer] °CYSTOSCOPY HOME CARE INSTRUCTIONS ° °Activity: °Rest for the remainder of the day.  Do not drive or operate equipment today.  You may resume normal activities in one to two days as instructed by your physician.  ° °Meals: °Drink plenty of liquids and eat light foods such as gelatin or soup this evening.  You may return to a normal meal plan tomorrow. ° °Return to Work: °You may return to work in one to two days or as instructed by your physician. ° °Special Instructions / Symptoms: °Call your physician if any of these symptoms occur: ° ° -persistent or heavy bleeding ° -bleeding which continues after first few urination ° -large blood clots that are difficult to pass ° -urine stream diminishes or stops completely ° -fever equal to or higher than 101 degrees Farenheit. ° -cloudy urine with a strong, foul odor ° -severe pain ° °Females should always wipe from   front to back after elimination.  You may feel some burning pain when you urinate.  This should disappear with time.  Applying moist heat to the lower abdomen or a hot tub bath may help relieve the pain. \ ° ° °Patient Signature:  ________________________________________________________ ° °Nurse's Signature:  ________________________________________________________ ° °

## 2016-08-29 NOTE — Anesthesia Procedure Notes (Signed)
Procedure Name: Intubation Date/Time: 08/29/2016 12:45 PM Performed by: Carleene Cooper A Pre-anesthesia Checklist: Patient identified, Emergency Drugs available, Suction available, Patient being monitored and Timeout performed Patient Re-evaluated:Patient Re-evaluated prior to inductionOxygen Delivery Method: Circle system utilized Preoxygenation: Pre-oxygenation with 100% oxygen Intubation Type: IV induction Ventilation: Mask ventilation without difficulty Laryngoscope Size: Mac and 4 Grade View: Grade I Tube type: Oral Tube size: 7.5 mm Number of attempts: 1 Airway Equipment and Method: Stylet Placement Confirmation: ETT inserted through vocal cords under direct vision,  positive ETCO2 and breath sounds checked- equal and bilateral Secured at: 23 cm Tube secured with: Tape Dental Injury: Teeth and Oropharynx as per pre-operative assessment  Comments: Easy intubation by Larene Beach, EMT student. ATOI.

## 2016-08-29 NOTE — Anesthesia Postprocedure Evaluation (Signed)
Anesthesia Post Note  Patient: [name] LoftyRichard W [name]  Procedure(s) Performed: Procedure(s) (LRB): CYSTOSCOPY/BILATERAL RETEROGRADE BILATERTAL URETEROSCOPY/STONE EXTRACTION WITH BASKET, RIGHT STENT PLACEMENT, HOLIUM LASER (Bilateral)  Patient location during evaluation: PACU Anesthesia Type: General Level of consciousness: awake and alert Pain management: pain level controlled Vital Signs Assessment: post-procedure vital signs reviewed and stable Respiratory status: spontaneous breathing, nonlabored ventilation, respiratory function stable and patient connected to nasal cannula oxygen Cardiovascular status: blood pressure returned to baseline and stable Postop Assessment: no signs of nausea or vomiting Anesthetic complications: no       Last Vitals:  Vitals:   08/29/16 1415 08/29/16 1430  BP: (!) 134/97 (!) 119/97  Pulse: 75 77  Resp: 19 15  Temp: 36.8 C 37.2 C                  Shelton SilvasKevin D Brandelyn Henne

## 2016-08-30 ENCOUNTER — Encounter (HOSPITAL_COMMUNITY): Payer: Self-pay | Admitting: Urology

## 2016-08-30 NOTE — Op Note (Signed)
NAMPhebe [name]:  [name], [name]               ACCOUNT NO.:  [id number]  MEDICAL RECORD NO.:  [id number]  LOCATION:                                 FACILITY:  PHYSICIAN:  [name] [name], M.D.         DATE OF BIRTH:  DATE OF PROCEDURE:  08/29/2016 DATE OF DISCHARGE:                              OPERATIVE REPORT   Patient of Dr. Bjorn PippinJohn Joaopedro [name].  PROCEDURE: 1. Cystoscopy with right retrograde pyelogram. 2. Bilateral ureteroscopy with laser tripsy and stone extraction. 3. Left ureteral stent insertion.  PREOPERATIVE DIAGNOSIS:  Bilateral ureteral stones.  POSTOPERATIVE DIAGNOSIS:  Bilateral ureteral stones.  SURGEON:  [name] SeltzerJohn J. Annabell HowellsWrenn, MD.  ANESTHESIA:  General.  SPECIMEN:  Stone fragments.  DRAINS:  Left 6-French x 26 cm Contour double-J stent with tether.  BLOOD LOSS:  Minimal.  COMPLICATIONS:  None.  INDICATIONS:  [name] [name] is a 46 year old white male presented through the emergency room with left flank pain last Friday.  [facility] and on CT was noted to have a left proximal ureteral stone.  He was also noted to have 2 left distal ureteral stones.  After reviewing the options, it was felt the ureteroscopy was indicated.  FINDINGS AND PROCEDURE:  He had a KUB prior to the procedure, which now showed the left ureteral stone in the distal ureter.  The right ureteral UVJ stones were unchanged in location.  He was given antibiotic and a general anesthetic was induced.  He was placed in lithotomy position and fitted with PAS hose.  His perineum and genitalia were prepped with Betadine solution and was draped in usual sterile fashion.  Cystoscopy was performed using a 23-French scope and 30-degree lens. Examination revealed a normal urethra.  The external sphincter was intact.  The prostatic urethra short without obstruction.  Examination of bladder revealed the smooth wall without tumor, stones, or inflammation.  The mucosa was unremarkable.  The ureteral  orifices were unremarkable.  The right ureteral orifice was cannulated with a 5-French open-end catheter and contrast was instilled.  This revealed a filling defect in the distal ureter consistent with the 2 stones with no proximal dilation.  A guidewire was then passed to the kidney without difficulty.  The cystoscope was removed.  A 12-French digital access sheath inner core was passed over the wire to dilate the distal ureter.  A 6.5-French semirigid ureteroscope was then passed alongside the wire and the stones were visualized.  They were felt to be too large for the right removal, so a 0.0365 micron laser fiber was inserted.  It was set on 0.5 watts and 20 hertz and the stone was broken into manageable fragments.  The fragments were then removed to the bladder with a NGage basket.  At this point, it was felt a stent was not indicated on the right, so the wire was removed.  The ureteroscope was then used to insert the wire up the left ureteral orifice.  I elected not to perform retrograde.  The wire was placed in the kidney under fluoroscopic guidance.  The ureteroscope was removed and the inner core digital access sheath was inserted again.  It was tighter on the left than the right, and I could not get it to the level of the stone.  The ureteroscope, however, could be passed alongside the wire up to the level of the stone.  The stone was grasped with a NGage basket to hold it in place to prevent retrograde movement.  The stone was engaged with the laser initially at 0.5 watts, but eventually was taken up to 1 watt to facilitate fragmentation.  Once the stone was fragmented adequately, the NGage basket was used to remove the fragments to the bladder.  At this point, cystoscope was reinserted alongside the wire and the bladder was evacuated free of stone fragments.  The cystoscope was then reinserted over the wire and a 6-French 26 cm Contour double-J stent with string  was inserted to the kidney under fluoroscopic guidance.  The wire was removed leaving good coil in the kidney and good coil in the bladder.  The bladder was drained and the cystoscope was removed leaving the stent string exiting urethra.  The string was secured to the patient's penis. The drapes removed.  He was taken down from lithotomy position and moved to the recovery room in stable condition.  There were no complications. The stone fragments were given to the family to bring it to the office for analysis.     [name] Seltzer. Annabell Howells, M.D.     JJW/MEDQ  D:  08/29/2016  T:  08/29/2016  Job:  [id number]

## 2016-09-08 ENCOUNTER — Ambulatory Visit (INDEPENDENT_AMBULATORY_CARE_PROVIDER_SITE_OTHER): Payer: Managed Care, Other (non HMO) | Admitting: Urology

## 2016-09-08 ENCOUNTER — Other Ambulatory Visit (HOSPITAL_COMMUNITY)
Admission: RE | Admit: 2016-09-08 | Discharge: 2016-09-08 | Disposition: A | Discharge status: Home / Self Care | Payer: Managed Care, Other (non HMO) | Source: Other Acute Inpatient Hospital | Attending: Urology | Admitting: Urology

## 2016-09-08 DIAGNOSIS — N201 Calculus of ureter: Secondary | ICD-10-CM | POA: Insufficient documentation

## 2016-09-11 NOTE — Addendum Note (Signed)
Addendum  created 09/11/16 1442 by Kima Malenfant, MD   Sign clinical note    

## 2016-09-11 NOTE — Anesthesia Postprocedure Evaluation (Signed)
Anesthesia Post Note  Patient: [name] [name]  Procedure(s) Performed: Procedure(s) (LRB): CYSTOSCOPY/BILATERAL RETEROGRADE BILATERTAL URETEROSCOPY/STONE EXTRACTION WITH BASKET, RIGHT STENT PLACEMENT, HOLIUM LASER (Bilateral)     Anesthesia Post Evaluation  Last Vitals:  Vitals:   08/29/16 1541 08/29/16 1545  BP:  (!) 140/110  Pulse: (P) 94 94  Resp:  20  Temp: (P) 37.3 C 37.3 C    Last Pain:  Vitals:   08/30/16 0906  TempSrc:   PainSc: 3                  Briunna Leicht S

## 2016-09-13 ENCOUNTER — Other Ambulatory Visit: Payer: Self-pay | Admitting: Urology

## 2016-09-13 DIAGNOSIS — N201 Calculus of ureter: Secondary | ICD-10-CM

## 2016-09-15 LAB — STONE ANALYSIS
CA OXALATE, MONOHYDR.: 85 %
CA PHOS CRY STONE QL IR: 15 %
STONE WEIGHT KSTONE: 150.6 mg

## 2016-09-18 ENCOUNTER — Ambulatory Visit (HOSPITAL_COMMUNITY): Admission: RE | Admit: 2016-09-18 | Payer: Managed Care, Other (non HMO) | Source: Ambulatory Visit

## 2016-09-22 ENCOUNTER — Ambulatory Visit (HOSPITAL_COMMUNITY): Payer: Managed Care, Other (non HMO) | Attending: Urology

## 2016-09-27 ENCOUNTER — Encounter: Payer: Self-pay | Admitting: Orthopaedic Surgery

## 2016-09-27 ENCOUNTER — Encounter (INDEPENDENT_AMBULATORY_CARE_PROVIDER_SITE_OTHER): Payer: Self-pay | Admitting: Orthopaedic Surgery

## 2016-09-27 ENCOUNTER — Other Ambulatory Visit (INDEPENDENT_AMBULATORY_CARE_PROVIDER_SITE_OTHER): Payer: Self-pay

## 2016-09-27 ENCOUNTER — Ambulatory Visit (INDEPENDENT_AMBULATORY_CARE_PROVIDER_SITE_OTHER): Payer: Managed Care, Other (non HMO) | Admitting: Orthopaedic Surgery

## 2016-09-27 VITALS — BP 137/85 | HR 70 | Ht 74.0 in | Wt 360.0 lb

## 2016-09-27 DIAGNOSIS — G8929 Other chronic pain: Secondary | ICD-10-CM

## 2016-09-27 DIAGNOSIS — M25561 Pain in right knee: Secondary | ICD-10-CM

## 2016-09-27 MED ORDER — BUPIVACAINE HCL 0.5 % IJ SOLN
3.0000 mL | INTRAMUSCULAR | Status: AC | PRN
  Administered 2016-09-27: 3 mL via INTRA_ARTICULAR

## 2016-09-27 MED ORDER — LIDOCAINE HCL 1 % IJ SOLN
5.0000 mL | INTRAMUSCULAR | Status: AC | PRN
  Administered 2016-09-27: 5 mL

## 2016-09-27 MED ORDER — METHYLPREDNISOLONE ACETATE 40 MG/ML IJ SUSP
80.0000 mg | INTRAMUSCULAR | Status: AC | PRN
  Administered 2016-09-27: 80 mg

## 2016-09-27 NOTE — Progress Notes (Signed)
Office Visit Note   Patient: [name] [name]           Date of Birth: 04/23/1970           MRN: [id number] Visit Date: 09/27/2016              Requested by: [facility] [address] [place], [place] [address] PCP: [facility]   Assessment & Plan: Visit Diagnoses:  1. Chronic pain of right knee    Osteoarthritis right knee, possible component of psoriatic arthritis Plan: Aspirate right knee (33 mL of blood-tinged fluid), send fluid for laboratory, injection with cortisone, referred to Dr. Corliss Skainseveshwar for evaluation of psoriasis  Follow-Up Instructions: Return if symptoms worsen or fail to improve.   Orders:  No orders of the defined types were placed in this encounter.  No orders of the defined types were placed in this encounter.     Procedures: Large Joint Inj Date/Time: 09/27/2016 10:59 AM Performed by: Valeria BatmanWHITFIELD, Armie Moren W Authorized by: Valeria BatmanWHITFIELD, Shyonna Carlin W   Consent Given by:  Patient Timeout: prior to procedure the correct patient, procedure, and site was verified   Indications:  Pain and joint swelling Location:  Knee Site:  R knee Prep: patient was prepped and draped in usual sterile fashion   Needle Size:  25 G Needle Length:  1.5 inches Approach:  Anteromedial Ultrasound Guidance: No   Fluoroscopic Guidance: No   Arthrogram: No   Medications:  5 mL lidocaine 1 %; 80 mg methylPREDNISolone acetate 40 MG/ML; 3 mL bupivacaine 0.5 % Aspiration Attempted: Yes   Aspirate amount (mL):  33 Aspirate:  Blood-tinged Lab: fluid sent for laboratory analysis   Patient tolerance:  Patient tolerated the procedure well with no immediate complications     Clinical Data: No additional findings.   Subjective: Chief Complaint  Patient presents with  . Right Knee - Pain, Edema    [name] [name] is a 46 y o that presents with R knee pain x 2 months. He went to [facility] , xr obtained, no findings. HX of kidney surgery 8 stones  Intermediate relates that  he's had problem with his right knee for "many months" he's had episodes of popping and swelling associated with "tightness". He notes it has been worse over the last several weeks without history of injury or trauma. His past history is significant that he does have psoriasis. He's also had a prior Carticel procedure by Dr. Chaney MallingMortenson., Recent films were performed of his right knee through his primary care physician. These were reviewed on the PACS system. There are significant tricompartmental degenerative changes with irregularity along the joint surface in the lateral medial compartment associated with decrease in the joint space. Appears that he has increased valgus with weightbearing. HPI  Review of Systems   Objective: Vital Signs: BP 137/85   Pulse 70   Ht 6\' 2"  (1.88 m)   Wt (!) 360 lb (163.3 kg)   BMI 46.22 kg/m   Physical Exam  Ortho Exam right knee with positive effusion. Mild patella crepitation was some pain. Mild medial joint pain. Full extension and flexion about 105. No opening with a varus or valgus stress. Negative anterior drawer sign. No calf pain. No swelling distally. Does have psoriatic lesions about his full right knee. Straight leg raise negative. Painless range of motion right hip.  Specialty Comments:  No specialty comments available.  Imaging: No results found.   PMFS History: There are no active problems to display for  this patient.  Past Medical History:  Diagnosis Date  . Arthritis   . Gout   . History of kidney stones   . Pneumonia    several years ago  . Pre-diabetes     No family history on file.  Past Surgical History:  Procedure Laterality Date  . CYSTOSCOPY/RETROGRADE/URETEROSCOPY/STONE EXTRACTION WITH BASKET Bilateral 08/29/2016   Procedure: CYSTOSCOPY/BILATERAL RETEROGRADE BILATERTAL URETEROSCOPY/STONE EXTRACTION WITH BASKET, RIGHT STENT PLACEMENT, HOLIUM LASER;  Surgeon: Bjorn Pippin, MD;  Location: [facility];  Service: Urology;   Laterality: Bilateral;  . KNEE SURGERY     x 4  . STONE EXTRACTION WITH BASKET    . STRABISMUS SURGERY Right 04/02/2015   Procedure: REPAIR STRABISMUS RIGHT EYE;  Surgeon: Verne Carrow, MD;  Location: [facility];  Service: Ophthalmology;  Laterality: Right;   Social History   Occupational History  . Not on file.   Social History Main Topics  . Smoking status: Never Smoker  . Smokeless tobacco: Never Used  . Alcohol use No  . Drug use: No  . Sexual activity: Not on file     Valeria Batman, MD   Note - This record has been created using [employer].  Chart creation errors have been sought, but may not always  have been located. Such creation errors do not reflect on  the standard of medical care.

## 2016-10-20 ENCOUNTER — Ambulatory Visit: Payer: Managed Care, Other (non HMO) | Admitting: Urology

## 2016-11-08 ENCOUNTER — Ambulatory Visit (INDEPENDENT_AMBULATORY_CARE_PROVIDER_SITE_OTHER): Payer: Managed Care, Other (non HMO) | Admitting: Orthopaedic Surgery

## 2016-11-08 ENCOUNTER — Other Ambulatory Visit (INDEPENDENT_AMBULATORY_CARE_PROVIDER_SITE_OTHER): Payer: Self-pay

## 2016-11-08 ENCOUNTER — Encounter (INDEPENDENT_AMBULATORY_CARE_PROVIDER_SITE_OTHER): Payer: Self-pay | Admitting: Orthopaedic Surgery

## 2016-11-08 VITALS — Ht 77.0 in | Wt 360.0 lb

## 2016-11-08 DIAGNOSIS — M23006 Cystic meniscus, unspecified meniscus, right knee: Secondary | ICD-10-CM | POA: Insufficient documentation

## 2016-11-08 DIAGNOSIS — M25561 Pain in right knee: Secondary | ICD-10-CM | POA: Diagnosis not present

## 2016-11-08 DIAGNOSIS — G8929 Other chronic pain: Secondary | ICD-10-CM | POA: Diagnosis not present

## 2016-11-08 DIAGNOSIS — M1711 Unilateral primary osteoarthritis, right knee: Secondary | ICD-10-CM

## 2016-11-08 DIAGNOSIS — E663 Overweight: Secondary | ICD-10-CM

## 2016-11-08 MED ORDER — LIDOCAINE HCL 1 % IJ SOLN
5.0000 mL | INTRAMUSCULAR | Status: AC | PRN
  Administered 2016-11-08: 5 mL

## 2016-11-08 MED ORDER — BUPIVACAINE HCL 0.5 % IJ SOLN
3.0000 mL | INTRAMUSCULAR | Status: AC | PRN
  Administered 2016-11-08: 3 mL via INTRA_ARTICULAR

## 2016-11-08 MED ORDER — METHYLPREDNISOLONE ACETATE 40 MG/ML IJ SUSP
80.0000 mg | INTRAMUSCULAR | Status: AC | PRN
  Administered 2016-11-08: 80 mg

## 2016-11-08 NOTE — Progress Notes (Signed)
Office Visit Note   Patient: [name] [name]           Date of Birth: 12/25/1970           MRN: [id number] Visit Date: 11/08/2016              Requested by: [facility] [address] [place], [place] [id number] PCP: [facility]   Assessment & Plan: Visit Diagnoses:  1. Chronic pain of right knee   2. Unilateral primary osteoarthritis, right knee     Plan:Aspirate right knee, inject with cortisone. PrecertVisco supplementation. Long discussion regarding treatment options for the osteoarthritis right knee including exercises, weight loss, Visco supplementation, NSAIDs. BMI is approximately 44. Time spent over 30 minutes regarding discussion of the problem, MRI scan results and counseling regarding future plans. Have suggested rheumatologic evaluation. I believe the problem with his right knee is a combination of factors including multiple surgeries, his weight, and possibly a component of psoriatic arthritis  Follow-Up Instructions: Return will pre cert euflexxa.   Orders:  No orders of the defined types were placed in this encounter.  No orders of the defined types were placed in this encounter.     Procedures: Large Joint Inj Date/Time: 11/08/2016 12:11 PM Performed by: Valeria BatmanWHITFIELD, Kedrick Mcnamee W Authorized by: Valeria BatmanWHITFIELD, Judyann Casasola W   Consent Given by:  Patient Timeout: prior to procedure the correct patient, procedure, and site was verified   Indications:  Pain and joint swelling Location:  Knee Site:  R knee Prep: patient was prepped and draped in usual sterile fashion   Needle Size:  25 G Needle Length:  1.5 inches Approach:  Anteromedial Ultrasound Guidance: No   Fluoroscopic Guidance: No   Arthrogram: No   Medications:  5 mL lidocaine 1 %; 80 mg methylPREDNISolone acetate 40 MG/ML; 3 mL bupivacaine 0.5 % Aspiration Attempted: Yes   Aspirate amount (mL):  23 Aspirate:  Blood-tinged Patient tolerance:  Patient tolerated the procedure well with no  immediate complications     Clinical Data: No additional findings.   Subjective: No chief complaint on file. [name] is accompanied by his wife is here for follow-up evaluation of problems having with his right knee. He's had multiple surgeries in the past starting at age about 89. He is experiencing recurrent pain swelling and a some extent stiffening. He's had difficulty performing his work related activity particularly when he is on his feet for a length of time. His primary care physician just recently ordered an MRI scan of his right knee. I have a copy for review. Medial meniscus was intact. There was degenerative maceration of the anterior horn of the lateral meniscus which was new from the prior study. Cruciates are intact. There is been worsening of the patellofemoral the GEN change with increased cartilage loss and osteophytosis there is mild degeneration of the medial compartment. There is been progression of cartilage loss along the central weightbearing surface and central tibial plateau the lateral compartment. Bulky tricompartmental osteophytosis has increased since the prior exam  HPI  Review of Systems   Objective: Vital Signs: Ht 6\' 5"  (1.956 m)   Wt (!) 360 lb (163.3 kg)   BMI 42.69 kg/m   Physical Exam  Ortho Exam right knee with positive effusion. Tenderness to patellar motion. Positive patellar crepitation. Increased valgus with weightbearing. No instability. Negative anterior drawer sign. Straight leg raise negative. No pain with range of motion of either hip. No swelling distally. Neurovascular exam intact. He appears  to have psoriatic skin lesions about his left knee.  Specialty Comments:  No specialty comments available.  Imaging: No results found.   PMFS History: Patient Active Problem List   Diagnosis Date Noted  . Unilateral primary osteoarthritis, right knee 11/08/2016   Past Medical History:  Diagnosis Date  . Arthritis   . Gout   . History  of kidney stones   . Pneumonia    several years ago  . Pre-diabetes     History reviewed. No pertinent family history.  Past Surgical History:  Procedure Laterality Date  . CYSTOSCOPY/RETROGRADE/URETEROSCOPY/STONE EXTRACTION WITH BASKET Bilateral 08/29/2016   Procedure: CYSTOSCOPY/BILATERAL RETEROGRADE BILATERTAL URETEROSCOPY/STONE EXTRACTION WITH BASKET, RIGHT STENT PLACEMENT, HOLIUM LASER;  Surgeon: Bjorn PippinWrenn, John, MD;  Location: [facility];  Service: Urology;  Laterality: Bilateral;  . KNEE SURGERY     x 4  . STONE EXTRACTION WITH BASKET    . STRABISMUS SURGERY Right 04/02/2015   Procedure: REPAIR STRABISMUS RIGHT EYE;  Surgeon: Verne CarrowWilliam Young, MD;  Location: [facility];  Service: Ophthalmology;  Laterality: Right;   Social History   Occupational History  . Not on file.   Social History Main Topics  . Smoking status: Never Smoker  . Smokeless tobacco: Never Used  . Alcohol use No  . Drug use: No  . Sexual activity: Not on file     Valeria BatmanPeter W Ramesha Poster, MD   Note - This record has been created using [employer] software.  Chart creation errors have been sought, but may not always  have been located. Such creation errors do not reflect on  the standard of medical care.

## 2016-11-09 ENCOUNTER — Telehealth (INDEPENDENT_AMBULATORY_CARE_PROVIDER_SITE_OTHER): Payer: Self-pay | Admitting: Orthopaedic Surgery

## 2016-11-09 NOTE — Telephone Encounter (Signed)
I sent the referral in from [facility] on 11/08/16. They will call him. LVMOM for pt.

## 2016-11-09 NOTE — Telephone Encounter (Signed)
Patient requesting a referral for [facility]. Please call with questions. Patient called to make appt, and they require a referral.

## 2016-12-06 ENCOUNTER — Ambulatory Visit (INDEPENDENT_AMBULATORY_CARE_PROVIDER_SITE_OTHER): Payer: Managed Care, Other (non HMO) | Admitting: Orthopaedic Surgery

## 2016-12-06 DIAGNOSIS — M1711 Unilateral primary osteoarthritis, right knee: Secondary | ICD-10-CM | POA: Diagnosis not present

## 2016-12-06 MED ORDER — HYLAN G-F 20 16 MG/2ML IX SOSY
16.0000 mg | PREFILLED_SYRINGE | INTRA_ARTICULAR | Status: AC | PRN
  Administered 2016-12-06: 16 mg via INTRA_ARTICULAR

## 2016-12-06 NOTE — Progress Notes (Signed)
Office Visit Note   Patient: [name] [name]           Date of Birth: 12-14-1970           MRN: [id number] Visit Date: 12/06/2016              Requested by: [facility] [address] [place], [place] [address] PCP: [facility]   Assessment & Plan: Visit Diagnoses:  1. Unilateral primary osteoarthritis, right knee     Plan: synviscInjection right knee. Follow up weekly for the next 2 weeks to complete the series  Follow-Up Instructions: Return in about 1 week (around 12/13/2016).   Orders:  No orders of the defined types were placed in this encounter.  No orders of the defined types were placed in this encounter.     Procedures: Large Joint Inj Date/Time: 12/06/2016 11:35 AM Performed by: Valeria Batman Authorized by: Valeria Batman   Consent Given by:  Patient Timeout: prior to procedure the correct patient, procedure, and site was verified   Indications:  Pain and joint swelling Location:  Knee Site:  R knee Prep: patient was prepped and draped in usual sterile fashion   Needle Size:  25 G Needle Length:  1.5 inches Approach:  Anteromedial Ultrasound Guidance: No   Fluoroscopic Guidance: No   Arthrogram: No   Medications:  16 mg Hylan 16 MG/2ML Aspiration Attempted: No   Patient tolerance:  Patient tolerated the procedure well with no immediate complications     Clinical Data: No additional findings.   Subjective: Chief Complaint  Patient presents with  . Right Knee - Injections    HPI  Review of Systems  Constitutional: Negative for fatigue.  HENT: Negative for hearing loss.   Respiratory: Negative for apnea, chest tightness and shortness of breath.   Cardiovascular: Negative for chest pain, palpitations and leg swelling.  Gastrointestinal: Negative for blood in stool, constipation and diarrhea.  Genitourinary: Negative for difficulty urinating.  Musculoskeletal: Positive for joint swelling. Negative for arthralgias,  back pain, myalgias, neck pain and neck stiffness.  Neurological: Negative for weakness, numbness and headaches.  Hematological: Does not bruise/bleed easily.  Psychiatric/Behavioral: Negative for sleep disturbance. The patient is not nervous/anxious.      Objective: Vital Signs: There were no vitals taken for this visit.  Physical Exam  Ortho Exam right knee not hot red or swollen.  Specialty Comments:  No specialty comments available.  Imaging: No results found.   PMFS History: Patient Active Problem List   Diagnosis Date Noted  . Unilateral primary osteoarthritis, right knee 11/08/2016   Past Medical History:  Diagnosis Date  . Arthritis   . Gout   . History of kidney stones   . Pneumonia    several years ago  . Pre-diabetes     No family history on file.  Past Surgical History:  Procedure Laterality Date  . CYSTOSCOPY/RETROGRADE/URETEROSCOPY/STONE EXTRACTION WITH BASKET Bilateral 08/29/2016   Procedure: CYSTOSCOPY/BILATERAL RETEROGRADE BILATERTAL URETEROSCOPY/STONE EXTRACTION WITH BASKET, RIGHT STENT PLACEMENT, HOLIUM LASER;  Surgeon: Bjorn Pippin, MD;  Location: [facility];  Service: Urology;  Laterality: Bilateral;  . KNEE SURGERY     x 4  . STONE EXTRACTION WITH BASKET    . STRABISMUS SURGERY Right 04/02/2015   Procedure: REPAIR STRABISMUS RIGHT EYE;  Surgeon: Verne Carrow, MD;  Location: [facility];  Service: Ophthalmology;  Laterality: Right;   Social History   Occupational History  . Not on file.   Social  History Main Topics  . Smoking status: Never Smoker  . Smokeless tobacco: Never Used  . Alcohol use No  . Drug use: No  . Sexual activity: Not on file

## 2016-12-06 NOTE — Progress Notes (Deleted)
Osteoarthritis right knee. [name] has been approved for Synvisc. We will plan on injecting his right knee today and follow up weekly for the next 2 weeks to complete the series.

## 2016-12-07 ENCOUNTER — Encounter: Payer: Managed Care, Other (non HMO) | Attending: Physician Assistant | Admitting: Nutrition

## 2016-12-07 VITALS — Ht 76.0 in | Wt 381.0 lb

## 2016-12-07 DIAGNOSIS — E119 Type 2 diabetes mellitus without complications: Secondary | ICD-10-CM | POA: Insufficient documentation

## 2016-12-07 DIAGNOSIS — Z713 Dietary counseling and surveillance: Secondary | ICD-10-CM | POA: Insufficient documentation

## 2016-12-07 DIAGNOSIS — E669 Obesity, unspecified: Secondary | ICD-10-CM | POA: Insufficient documentation

## 2016-12-07 DIAGNOSIS — Z6841 Body Mass Index (BMI) 40.0 and over, adult: Secondary | ICD-10-CM

## 2016-12-07 NOTE — Progress Notes (Signed)
  Medical Nutrition Therapy:  Appt start time: 0800 end time:  0930.   Assessment:  Primary concerns today: Obesity and Diabetes Type 2.. A1C is 6.5% he reports. BMI is 46. Sees Dr. Cleophas DunkerWhitfield for knee problems and needs surgery He notes he needs to lose weight to have surgery.. Had gotten down to 330 lbs and then developed knee problems and can't walk much anymore for exercise. Was walking 3-4  Miles  on his days off.   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     Desired personal goal would like to see about getting his weight down to 260 lbs. He notes he isn't on anything for his diabetes since his A1C I s 6.5%. He is not testing blood sugars. Weight loss will help improve his DM Type 2.. He would still benefit form Metformin due to obesity and other CVD risk factors. He notes his LDL may have been a little high and he had a lower HDL.     He is motivated to lose weight. He  Is not interested in bariatric surgery. He would like to lose weight on his own.    Diet is high in sugar, salt and fat and low in fresh fruits and vegetables. He admits to drinking a lot of soda.  Preferred Learning Style:    No preference indicated   Learning Readiness  Ready  Change in progress   MEDICATIONS: See list   DIETARY INTAKE:  24-hr recall:  B ( AM): Apple jacks with marshmallows and 2% milk,  2 cups, OR bacon egg and cheese sandwich or omelet,  Sundrop Snk ( AM): cashews,  Soda  L ( PM): Chicken supreme- 4 chicken tendersr with seasoned frieds and biscuit, 16  Oz Mt Dew [employer] Snk ( PM): popcorn or chips, soda D ( PM): Chop sirloin, grilled onions, creamed potatoes, yeast roll, slaw, Coke Snk ( PM):  Beverages: soda,  Some water  Usual physical activity: ADL  Estimated energy needs: 1800  calories 200 g carbohydrates 120 g protein 44 g fat  Progress Towards Goal(s):  In progress.   Nutritional Diagnosis:  NB-1.1 Food and  nutrition-related knowledge deficit As related to OBesity and Type 2 DM.  As evidenced by BMI > 40 and A1C 6.5%..    Intervention: Nutrition and Diabetes education provided on My Plate, CHO counting, meal planning, portion sizes, timing of meals, avoiding snacks between meals and blood sugars, signs/symptoms and treatment of hyper/hypoglycemia,  benefits of exercising 30 minutes per day, healthy weight loss tips and prevention of complications of DM. Heart Healthy Low Fat High Fiber Diet. .  Teaching Method Utilized:  Visual Auditory Hands on  Handouts given during visit include:  The PLate Method  Meal Plan Card  Weight loss tips  Barriers to learning/adherence to lifestyle change:knee problems limits walking  Demonstrated degree of understanding via:  Teach Back   Monitoring/Evaluation:  Dietary intake, exercise, meal planning food journa, and body weight in 1 month(s).

## 2016-12-07 NOTE — Patient Instructions (Signed)
Goals 1.Follow the Plate Method    3-4 carb choices per meal 2. Cut sodas and only drink water 3. Use water therapy 2-3 times per week 4. Cut out fast food and processed foods Increase fresh fruits and vegetables Lose 2-3 lbs per week

## 2016-12-13 ENCOUNTER — Ambulatory Visit (INDEPENDENT_AMBULATORY_CARE_PROVIDER_SITE_OTHER): Payer: Managed Care, Other (non HMO) | Admitting: Orthopaedic Surgery

## 2016-12-13 DIAGNOSIS — M1711 Unilateral primary osteoarthritis, right knee: Secondary | ICD-10-CM

## 2016-12-13 MED ORDER — HYLAN G-F 20 16 MG/2ML IX SOSY
16.0000 mg | PREFILLED_SYRINGE | INTRA_ARTICULAR | Status: AC | PRN
  Administered 2016-12-13: 16 mg via INTRA_ARTICULAR

## 2016-12-13 NOTE — Progress Notes (Signed)
   Office Visit Note   Patient: [name] [name]           Date of Birth: 08/10/1970           MRN: [id number] Visit Date: 12/13/2016              Requested by: [facility] [address] [place], [place] [id number] PCP: [facility]   Assessment & Plan: Visit Diagnoses:  1. Unilateral primary osteoarthritis, right knee     Plan:office 1 week to complete synvisc series. Second inj today  Follow-Up Instructions: Return in about 1 week (around 12/20/2016).   Orders:  No orders of the defined types were placed in this encounter.  No orders of the defined types were placed in this encounter.     Procedures: Large Joint Inj Date/Time: 12/13/2016 4:12 PM Performed by: Valeria BatmanWHITFIELD, Brodyn Depuy W Authorized by: Valeria BatmanWHITFIELD, Alwin Lanigan W   Consent Given by:  Patient Timeout: prior to procedure the correct patient, procedure, and site was verified   Indications:  Pain and joint swelling Location:  Knee Site:  R knee Prep: patient was prepped and draped in usual sterile fashion   Needle Size:  25 G Needle Length:  1.5 inches Approach:  Anteromedial Ultrasound Guidance: No   Fluoroscopic Guidance: No   Arthrogram: No   Medications:  16 mg Hylan 16 MG/2ML Aspiration Attempted: No   Patient tolerance:  Patient tolerated the procedure well with no immediate complications     Clinical Data: No additional findings.   Subjective: No chief complaint on file. First synvisc inj last week-no problems  HPI  Review of Systems   Objective: Vital Signs: There were no vitals taken for this visit.  Physical Exam  Ortho Examright knee benign  Specialty Comments:  No specialty comments available.  Imaging: No results found.   PMFS History: Patient Active Problem List   Diagnosis Date Noted  . Unilateral primary osteoarthritis, right knee 11/08/2016   Past Medical History:  Diagnosis Date  . Arthritis   . Gout   . History of kidney stones   . Pneumonia    several years ago  . Pre-diabetes     No family history on file.  Past Surgical History:  Procedure Laterality Date  . CYSTOSCOPY/RETROGRADE/URETEROSCOPY/STONE EXTRACTION WITH BASKET Bilateral 08/29/2016   Procedure: CYSTOSCOPY/BILATERAL RETEROGRADE BILATERTAL URETEROSCOPY/STONE EXTRACTION WITH BASKET, RIGHT STENT PLACEMENT, HOLIUM LASER;  Surgeon: Bjorn PippinWrenn, John, MD;  Location: [facility];  Service: Urology;  Laterality: Bilateral;  . KNEE SURGERY     x 4  . STONE EXTRACTION WITH BASKET    . STRABISMUS SURGERY Right 04/02/2015   Procedure: REPAIR STRABISMUS RIGHT EYE;  Surgeon: Verne CarrowWilliam Young, MD;  Location: [facility];  Service: Ophthalmology;  Laterality: Right;   Social History   Occupational History  . Not on file.   Social History Main Topics  . Smoking status: Never Smoker  . Smokeless tobacco: Never Used  . Alcohol use No  . Drug use: No  . Sexual activity: Not on file     Valeria BatmanPeter W Lysandra Loughmiller, MD   Note - This record has been created using [employer] software.  Chart creation errors have been sought, but may not always  have been located. Such creation errors do not reflect on  the standard of medical care.

## 2016-12-20 ENCOUNTER — Ambulatory Visit (INDEPENDENT_AMBULATORY_CARE_PROVIDER_SITE_OTHER): Payer: Managed Care, Other (non HMO) | Admitting: Orthopedic Surgery

## 2017-01-08 ENCOUNTER — Telehealth: Payer: Self-pay | Admitting: Nutrition

## 2017-01-08 ENCOUNTER — Ambulatory Visit: Payer: Managed Care, Other (non HMO) | Admitting: Nutrition

## 2017-01-08 NOTE — Telephone Encounter (Signed)
Vm to call and reschedule missed appt. 

## 2017-01-25 NOTE — Progress Notes (Deleted)
   Office Visit Note  Patient: [name] [name]             Date of Birth: 05/31/1970           MRN: [id number]             PCP: [facility] Referring: [facility]* Visit Date: 01/26/2017 Occupation: @GUAROCC @    Subjective:  No chief complaint on file.   History of Present Illness: [name] LoftyRichard W Turan is a 46 y.o. male ***   Activities of Daily Living:  Patient reports morning stiffness for *** {minute/hour:19697}.   Patient {ACTIONS;DENIES/REPORTS:21021675::"Denies"} nocturnal pain.  Difficulty dressing/grooming: {ACTIONS;DENIES/REPORTS:21021675::"Denies"} Difficulty climbing stairs: {ACTIONS;DENIES/REPORTS:21021675::"Denies"} Difficulty getting out of chair: {ACTIONS;DENIES/REPORTS:21021675::"Denies"} Difficulty using hands for taps, buttons, cutlery, and/or writing: {ACTIONS;DENIES/REPORTS:21021675::"Denies"}   No Rheumatology ROS completed.   PMFS History:  Patient Active Problem List   Diagnosis Date Noted  . Unilateral primary osteoarthritis, right knee 11/08/2016    Past Medical History:  Diagnosis Date  . Arthritis   . Gout   . History of kidney stones   . Pneumonia    several years ago  . Pre-diabetes     No family history on file. Past Surgical History:  Procedure Laterality Date  . CYSTOSCOPY/RETROGRADE/URETEROSCOPY/STONE EXTRACTION WITH BASKET Bilateral 08/29/2016   Procedure: CYSTOSCOPY/BILATERAL RETEROGRADE BILATERTAL URETEROSCOPY/STONE EXTRACTION WITH BASKET, RIGHT STENT PLACEMENT, HOLIUM LASER;  Surgeon: Bjorn PippinWrenn, John, MD;  Location: [facility];  Service: Urology;  Laterality: Bilateral;  . KNEE SURGERY     x 4  . STONE EXTRACTION WITH BASKET    . STRABISMUS SURGERY Right 04/02/2015   Procedure: REPAIR STRABISMUS RIGHT EYE;  Surgeon: Verne CarrowWilliam Young, MD;  Location: [facility];  Service: Ophthalmology;  Laterality: Right;   Social History   Social History Narrative  . No narrative on file     Objective: Vital  Signs: There were no vitals taken for this visit.   Physical Exam   Musculoskeletal Exam: ***  CDAI Exam: No CDAI exam completed.    Investigation: No additional findings. 09/27/2016 synovial fluid WBC count 1200, crystals negative 09/08/2016 renal stone analysis calcium oxalate and calcium phosphate 08/29/2016 CBC normal, BMP glucose 149 creatinine 1.43 GFR 58 Imaging: No results found.  Speciality Comments: No specialty comments available.    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  No orders of the defined types were placed in this encounter.   Face-to-face time spent with patient was *** minutes. 50% of time was spent in counseling and coordination of care.  Follow-Up Instructions: No Follow-up on file.   Pollyann SavoyShaili Davelle Anselmi, MD  Note - This record has been created using Animal nutritionistDragon software.  Chart creation errors have been sought, but may not always  have been located. Such creation errors do not reflect on  the standard of medical care.

## 2017-01-26 ENCOUNTER — Ambulatory Visit: Payer: Managed Care, Other (non HMO) | Admitting: Rheumatology

## 2017-02-20 ENCOUNTER — Other Ambulatory Visit: Payer: Self-pay | Admitting: Urology

## 2017-02-20 DIAGNOSIS — N201 Calculus of ureter: Secondary | ICD-10-CM

## 2017-02-21 ENCOUNTER — Ambulatory Visit: Payer: Managed Care, Other (non HMO) | Admitting: Rheumatology

## 2017-03-05 ENCOUNTER — Ambulatory Visit (HOSPITAL_COMMUNITY)
Admission: RE | Admit: 2017-03-05 | Discharge: 2017-03-05 | Disposition: A | Discharge status: Home / Self Care | Payer: Managed Care, Other (non HMO) | Source: Ambulatory Visit | Attending: Urology | Admitting: Urology

## 2017-03-05 ENCOUNTER — Encounter (HOSPITAL_COMMUNITY): Payer: Self-pay

## 2017-03-05 ENCOUNTER — Ambulatory Visit (HOSPITAL_COMMUNITY): Payer: Managed Care, Other (non HMO)

## 2017-03-16 ENCOUNTER — Ambulatory Visit: Payer: Managed Care, Other (non HMO) | Admitting: Urology

## 2017-05-11 ENCOUNTER — Encounter: Payer: Self-pay | Admitting: Cardiovascular Disease

## 2017-05-11 ENCOUNTER — Ambulatory Visit (INDEPENDENT_AMBULATORY_CARE_PROVIDER_SITE_OTHER): Payer: Managed Care, Other (non HMO) | Admitting: Cardiovascular Disease

## 2017-05-11 VITALS — BP 158/90 | HR 90 | Ht 76.0 in | Wt 366.0 lb

## 2017-05-11 DIAGNOSIS — R030 Elevated blood-pressure reading, without diagnosis of hypertension: Secondary | ICD-10-CM

## 2017-05-11 DIAGNOSIS — E119 Type 2 diabetes mellitus without complications: Secondary | ICD-10-CM | POA: Diagnosis not present

## 2017-05-11 DIAGNOSIS — R000 Tachycardia, unspecified: Secondary | ICD-10-CM | POA: Diagnosis not present

## 2017-05-11 NOTE — Progress Notes (Signed)
CARDIOLOGY CONSULT NOTE  Patient ID: [name] [name] MRN: [id number] DOB/AGE: 08-17-1970 46 y.o.  Admit date: (Not on file) Primary Physician: [facility] Referring Physician: Allwardt, Alyssa, PA.   Reason for Consultation: Paroxysmal tachycardia  HPI: [name] [name] is a 47 y.o. male who is being seen today for the evaluation of paroxysmal tachycardia at the request of Allwardt, Alyssa, PA.   Past medical history includes hypertension, type 2 diabetes mellitus, and morbid obesity.  I reviewed records from his PCP.  He has no history of chest pain, shortness of breath, or leg edema.  I reviewed labs performed on 04/24/17: Sodium 139, potassium 4.3, BUN 12, creatinine 0.89, hemoglobin 14.9.  He had apparently been at work and noticed his hand started to shake.  He went and saw the nurse at work and his heart rate was 120 bpm, blood pressure 118/82.  He denies any chest pain or palpitations or shortness of breath at that time.  He has some urinary retention.  He attributes his fast heart rate due to being back on his first day at work at the beginning of every week.  He said it remains elevated all day long.  He said on the subsequent days his heart rate is in the 70-80 bpm range.  He also has a history of right knee arthritis.  He said he walks about 4 miles on concrete floors all day at work.  His blood pressure at work this past Wednesday was 112/74.  It is 158/90 today.  He just got finished playing basketball.  He has lost 15 pounds with dietary and exercise modification since 04/16/17.  He denies lightheadedness, dizziness, palpitations, chest pain, shortness of breath, leg edema, and syncope.  He denies fatigue.  ECG performed in our office today which I personally interpreted demonstrated sinus rhythm with sinus arrhythmia, 90 bpm, incomplete right bundle branch block, and Q waves in lateral leads.    Allergies  Allergen Reactions  . Penicillins  Other (See Comments)    Has patient had a PCN reaction causing immediate rash, facial/tongue/throat swelling, SOB or lightheadedness with hypotension: Unknown Has patient had a PCN reaction causing severe rash involving mucus membranes or skin necrosis: Unknown Has patient had a PCN reaction that required hospitalization: Unknown Has patient had a PCN reaction occurring within the last 10 years: No Childhood reaction If all of the above answers are "NO", then may proceed with Cephalosporin use.     Current Outpatient Medications  Medication Sig Dispense Refill  . ibuprofen (ADVIL,MOTRIN) 800 MG tablet Take 800 mg by mouth every 8 (eight) hours as needed.    . metFORMIN (GLUCOPHAGE) 500 MG tablet Take by mouth 2 (two) times daily with a meal.    . naproxen (EC NAPROSYN) 500 MG EC tablet Take 500 mg by mouth as needed.      No current facility-administered medications for this visit.     Past Medical History:  Diagnosis Date  . Arthritis   . Gout   . History of kidney stones   . Pneumonia    several years ago  . Pre-diabetes     Past Surgical History:  Procedure Laterality Date  . CYSTOSCOPY/RETROGRADE/URETEROSCOPY/STONE EXTRACTION WITH BASKET Bilateral 08/29/2016   Procedure: CYSTOSCOPY/BILATERAL RETEROGRADE BILATERTAL URETEROSCOPY/STONE EXTRACTION WITH BASKET, RIGHT STENT PLACEMENT, HOLIUM LASER;  Surgeon: Bjorn Pippin, MD;  Location: [facility];  Service: Urology;  Laterality: Bilateral;  . KNEE SURGERY     x 4  .  STONE EXTRACTION WITH BASKET    . STRABISMUS SURGERY Right 04/02/2015   Procedure: REPAIR STRABISMUS RIGHT EYE;  Surgeon: Verne CarrowWilliam Young, MD;  Location: [facility];  Service: Ophthalmology;  Laterality: Right;    Social History   Socioeconomic History  . Marital status: Married    Spouse name: Not on file  . Number of children: Not on file  . Years of education: Not on file  . Highest education level: Not on file  Social Needs  . Financial resource  strain: Not on file  . Food insecurity - worry: Not on file  . Food insecurity - inability: Not on file  . Transportation needs - medical: Not on file  . Transportation needs - non-medical: Not on file  Occupational History  . Not on file  Tobacco Use  . Smoking status: Never Smoker  . Smokeless tobacco: Never Used  Substance and Sexual Activity  . Alcohol use: No  . Drug use: No  . Sexual activity: Not on file  Other Topics Concern  . Not on file  Social History Narrative  . Not on file     No family history of premature CAD in 1st degree relatives.  Current Meds  Medication Sig  . ibuprofen (ADVIL,MOTRIN) 800 MG tablet Take 800 mg by mouth every 8 (eight) hours as needed.  . metFORMIN (GLUCOPHAGE) 500 MG tablet Take by mouth 2 (two) times daily with a meal.  . naproxen (EC NAPROSYN) 500 MG EC tablet Take 500 mg by mouth as needed.       Review of systems complete and found to be negative unless listed above in HPI    Physical exam Blood pressure (!) 158/90, pulse 90, height 6\' 4"  (1.93 m), weight (!) 366 lb (166 kg). General: NAD Neck: No JVD, no thyromegaly or thyroid nodule.  Lungs: Clear to auscultation bilaterally with normal respiratory effort. CV: Nondisplaced PMI. Regular rate and rhythm, normal S1/S2, no S3/S4, no murmur.  No peripheral edema.  No carotid bruit.    Abdomen: Soft, nontender, obese.  Skin: Intact without lesions or rashes.  Neurologic: Alert and oriented x 3.  Psych: Normal affect. Extremities: No clubbing or cyanosis.  HEENT: Normal.   ECG: Most recent ECG reviewed.   Labs: Lab Results  Component Value Date/Time   K 4.5 08/29/2016 10:00 AM   BUN 18 08/29/2016 10:00 AM   CREATININE 1.43 (H) 08/29/2016 10:00 AM   HGB 15.9 08/29/2016 10:00 AM     Lipids: No results found for: LDLCALC, LDLDIRECT, CHOL, TRIG, HDL      ASSESSMENT AND PLAN:  1.  Tachycardia: I will obtain a 1 week event monitor to evaluate for tachyarrhythmias or  perhaps inappropriate sinus tachycardia.  2.  Type 2 diabetes mellitus: Recently started on metformin.  I encouraged him on his weight loss.  3.  Elevated blood pressure: Blood pressure has otherwise been reportedly normal at work.  There is a nurse at work once a week and he has an aunt who is a Publishing rights managernurse practitioner who lives a mile away.  I will provide him with a blood pressure log and have asked him to check his blood pressure twice per week along with his heart rate.     Disposition: Follow up in 2 months  Signed: Prentice DockerSuresh Koneswaran, M.D., F.A.C.C.  05/11/2017, 2:50 PM

## 2017-05-11 NOTE — Patient Instructions (Addendum)
Medication Instructions:  Continue all current medications.  Labwork: none  Testing/Procedures:  Your physician has recommended that you wear a 7 day event monitor. Event monitors are medical devices that record the heart's electrical activity. Doctors most often us these monitors to diagnose arrhythmias. Arrhythmias are problems with the speed or rhythm of the heartbeat. The monitor is a small, portable device. You can wear one while you do your normal daily activities. This is usually used to diagnose what is causing palpitations/syncope (passing out).  Office will contact with results via phone or letter.    Follow-Up: 2 months   Any Other Special Instructions Will Be Listed Below (If Applicable). Your physician has requested that you regularly monitor and record your blood pressure.  Please check 2 x week for 6 weeks.  Return log to office for MD review.   If you need a refill on your cardiac medications before your next appointment, please call your pharmacy.

## 2017-05-11 NOTE — Addendum Note (Signed)
Addended by: Lesle ChrisHILL, ANGELA G on: 05/11/2017 03:18 PM   Modules accepted: Orders

## 2017-06-06 ENCOUNTER — Encounter: Payer: Managed Care, Other (non HMO) | Attending: Physician Assistant | Admitting: Nutrition

## 2017-06-06 ENCOUNTER — Encounter: Payer: Self-pay | Admitting: Nutrition

## 2017-06-06 VITALS — Ht 76.0 in | Wt 360.0 lb

## 2017-06-06 DIAGNOSIS — E1165 Type 2 diabetes mellitus with hyperglycemia: Secondary | ICD-10-CM

## 2017-06-06 DIAGNOSIS — Z713 Dietary counseling and surveillance: Secondary | ICD-10-CM | POA: Insufficient documentation

## 2017-06-06 DIAGNOSIS — E118 Type 2 diabetes mellitus with unspecified complications: Secondary | ICD-10-CM | POA: Diagnosis not present

## 2017-06-06 DIAGNOSIS — IMO0002 Reserved for concepts with insufficient information to code with codable children: Secondary | ICD-10-CM

## 2017-06-06 NOTE — Patient Instructions (Signed)
Goals 1. Follow My Plate 2. Measure foods out 3. Cut out SF lemonade and drink only water-a gallon a day. 4. Exercise 60 minutes 3-5 days a week. 5. Lose 2 lbs per week Test blood sugars twice a day Get A1C down to less than 7%.

## 2017-06-06 NOTE — Progress Notes (Signed)
  Medical Nutrition Therapy:  Appt start time: 0800 end time:  0900.   Assessment:  Primary concerns today: DM Type 2 and Obesity. He lives withi his wife and kids. He works at [employer]. Eats three meals per day. No family history of DM. PCP [facility].  A1C in Jan 2019 was 10.4% and then re cheeked in Feb 2019 and it was down to 8.4% after making changes with his diet and starting on Metformin. On Metformin 500 mg BID.  [facility]: [facility].   Lost 21 lbs in the last 6 months. Symptoms, : blurry vision, increased thirst, frequent urination and unexplained weight loss.. Is testing with a meter- FBS-138 mg/dl this am.   Current diet is inconsistent to meet his needs. Diet had been much higher in carbs, fat and sodium. He has cut out a lot of processed and high fat foods.   Preferred Learning Style:   No preference indicated   Learning Readiness:  Ready  Change in progress   MEDICATIONS:   DIETARY INTAKE:  24-hr recall:  B ( AM): 2 eggs, 4 strawberries and 1 slice toast and 8 oz 2% milk.  Snk ( AM): almonds or fruit  L ( PM):  Brunswick stew  1 c or salad, water or SF Lemondade Snk ( PM): almonds D ( PM): Shrilled shrimp, onions, mushrooms and SF lemonade  Snk ( PM):  Beverages: Water and SF lemonade.  Usual physical activity: Plays a little basketball and [facility] [employer] [facility].   Estimated energy needs: 1800  calories 200  g carbohydrates 135 g protein 50 g fat  Progress Towards Goal(s):  In progress.   Nutritional Diagnosis:  NB-1.1 Food and nutrition-related knowledge deficit As related to Diabetes and Obesity.  As evidenced by A1C 8.4% and BMI 43.     Intervention:  Nutrition and Diabetes education provided on My Plate, CHO counting, meal planning, portion sizes, timing of meals, avoiding snacks between meals unless having a low blood sugar, target ranges for A1C and blood sugars, signs/symptoms and treatment of  hyper/hypoglycemia, monitoring blood sugars, taking medications as prescribed, benefits of exercising 30 minutes per day and prevention of complications of DM.  Marland Kitchen.Goals 1. Follow My Plate 2. Measure foods out 3. Cut out SF lemonade and drink only water-a gallon a day. 4. Exercise 60 minutes 3-5 days a week. 5. Lose 2 lbs per week Test blood sugars twice a day Get A1C down to less than 7%.    Teaching Method Utilized:  Visual Auditory Hands on  Handouts given during visit include:  The Plate Method  Meal Plan Card  Barriers to learning/adherence to lifestyle change: none  Demonstrated degree of understanding via:  Teach Back   Monitoring/Evaluation:  Dietary intake, exercise, meal planning, SBG, and body weight in 1 month(s).

## 2017-07-12 ENCOUNTER — Ambulatory Visit: Payer: Managed Care, Other (non HMO) | Admitting: Cardiovascular Disease

## 2017-08-06 ENCOUNTER — Ambulatory Visit: Payer: Managed Care, Other (non HMO) | Admitting: Nutrition

## 2018-02-07 DIAGNOSIS — M1711 Unilateral primary osteoarthritis, right knee: Secondary | ICD-10-CM | POA: Insufficient documentation

## 2018-03-11 DIAGNOSIS — Z9889 Other specified postprocedural states: Secondary | ICD-10-CM | POA: Insufficient documentation

## 2019-06-26 DIAGNOSIS — R0600 Dyspnea, unspecified: Secondary | ICD-10-CM | POA: Insufficient documentation

## 2019-06-26 DIAGNOSIS — R0609 Other forms of dyspnea: Secondary | ICD-10-CM | POA: Insufficient documentation

## 2019-06-26 DIAGNOSIS — I10 Essential (primary) hypertension: Secondary | ICD-10-CM | POA: Diagnosis present

## 2019-07-30 ENCOUNTER — Ambulatory Visit: Payer: Managed Care, Other (non HMO) | Admitting: Urology

## 2019-09-12 ENCOUNTER — Ambulatory Visit: Payer: Managed Care, Other (non HMO) | Admitting: Urology

## 2019-12-18 ENCOUNTER — Encounter: Payer: Self-pay | Admitting: Orthopaedic Surgery

## 2019-12-18 ENCOUNTER — Ambulatory Visit: Payer: Self-pay

## 2019-12-18 ENCOUNTER — Other Ambulatory Visit: Payer: Self-pay

## 2019-12-18 ENCOUNTER — Ambulatory Visit (INDEPENDENT_AMBULATORY_CARE_PROVIDER_SITE_OTHER): Payer: BC Managed Care – PPO | Admitting: Orthopaedic Surgery

## 2019-12-18 VITALS — Ht 75.0 in | Wt 339.0 lb

## 2019-12-18 DIAGNOSIS — M25561 Pain in right knee: Secondary | ICD-10-CM | POA: Diagnosis not present

## 2019-12-18 DIAGNOSIS — M1711 Unilateral primary osteoarthritis, right knee: Secondary | ICD-10-CM | POA: Diagnosis not present

## 2019-12-18 DIAGNOSIS — G8929 Other chronic pain: Secondary | ICD-10-CM

## 2019-12-18 MED ORDER — LIDOCAINE HCL 1 % IJ SOLN
2.0000 mL | INTRAMUSCULAR | Status: AC | PRN
  Administered 2019-12-18: 2 mL

## 2019-12-18 MED ORDER — METHYLPREDNISOLONE ACETATE 40 MG/ML IJ SUSP
80.0000 mg | INTRAMUSCULAR | Status: AC | PRN
  Administered 2019-12-18: 80 mg via INTRA_ARTICULAR

## 2019-12-18 MED ORDER — TRAMADOL HCL 50 MG PO TABS: ORAL_TABLET | ORAL | 0 refills | Status: DC

## 2019-12-18 MED ORDER — BUPIVACAINE HCL 0.5 % IJ SOLN
2.0000 mL | INTRAMUSCULAR | Status: AC | PRN
  Administered 2019-12-18: 2 mL via INTRA_ARTICULAR

## 2019-12-18 NOTE — Progress Notes (Signed)
Office Visit Note   Patient: [name] [name]           Date of Birth: 08/14/1970           MRN: [id number] Visit Date: 12/18/2019              Requested by: [facility], [facility] [address] [place],  [place] [address] PCP: [facility], [facility]   Assessment & Plan: Visit Diagnoses:  1. Chronic pain of right knee   2. Unilateral primary osteoarthritis, right knee   3. Morbid (severe) obesity due to excess calories [facility])     Plan: [name] [name] has end-stage osteoarthritis of his right knee.  He has increased valgus with nearly bone-on-bone in the lateral compartment with considerable degenerative changes in the other 2 compartments as well.  Long discussion regarding treatment options.  His present BMI is approximately 41.  He needs to lose at least another 30 pounds to get below 300 pounds.  He is 6 feet 4 inches tall.  I will inject his knee with cortisone today.  Urged him to work on strengthening exercises and continue with his weight loss.  Tramadol for pain with minimal use  Follow-Up Instructions: Return if symptoms worsen or fail to improve.   Orders:  Orders Placed This Encounter  Procedures  . Large Joint Inj: R knee  . XR KNEE 3 VIEW RIGHT   Meds ordered this encounter  Medications  . traMADol (ULTRAM) 50 MG tablet    Sig: 1 tablet PO q HS prn.    Dispense:  30 tablet    Refill:  0      Procedures: Large Joint Inj: R knee on 12/18/2019 2:18 PM Indications: pain and diagnostic evaluation Details: 25 G 1.5 in needle, anteromedial approach  Arthrogram: No  Medications: 2 mL lidocaine 1 %; 2 mL bupivacaine 0.5 %; 80 mg methylPREDNISolone acetate 40 MG/ML Procedure, treatment alternatives, risks and benefits explained, specific risks discussed. Consent was given by the patient. Immediately prior to procedure a time out was called to verify the correct patient, procedure, equipment, support staff and site/side marked as required. Patient was prepped and draped in the  usual sterile fashion.       Clinical Data: No additional findings.   Subjective: Chief Complaint  Patient presents with  . Right Knee - Pain  Patient presents today for chronic right knee pain. He was last here in 2018 and completed two doses of Synvisc. He states that he continues to hurt, but has really worsened in the last 4-5weeks. No known injury. He is unable to fully extend or flex his his knee. Most of his pain is located posteriorly. No swelling. He said that it feels like it will "explode" when he first gets up after resting. He has taken Tylenol and Ibuprofen. He works 12hours shifts on his feet and states that on those nights he has a very difficult time going to sleep due to pain. He has a history of 4 knee surgeries with [name] [name] in the past.  Several years ago had another knee arthroscopy by [name] [name] in [place].  Continues to have pain to the point of compromise particularly trying to sleep at night.  HPI  Review of Systems  Constitutional: Negative for fatigue.  HENT: Negative for ear pain.   Eyes: Negative for pain.  Respiratory: Negative for shortness of breath.   Cardiovascular: Negative for leg swelling.  Gastrointestinal: Negative for constipation.  Endocrine: Negative for cold intolerance and  heat intolerance.  Genitourinary: Negative for difficulty urinating.  Musculoskeletal: Negative for joint swelling.  Skin: Negative for rash.  Allergic/Immunologic: Negative for food allergies.  Neurological: Negative for weakness.  Hematological: Does not bruise/bleed easily.  Psychiatric/Behavioral: Positive for sleep disturbance.     Objective: Vital Signs: Ht 6\' 3"  (1.905 m)   Wt (!) 339 lb (153.8 kg)   BMI 42.37 kg/m   Physical Exam Constitutional:      Appearance: He is well-developed.  Eyes:     Pupils: Pupils are equal, round, and reactive to light.  Pulmonary:     Effort: Pulmonary effort is normal.  Skin:    General: Skin is warm and dry.   Neurological:     Mental Status: He is alert and oriented to person, place, and time.  Psychiatric:        Behavior: Behavior normal.     Ortho Exam awake alert and oriented x3.  Comfortable sitting.  BMI is approximately 42.  Large knees.  May be very small effusion.  Increased valgus with weightbearing right knee.  Predominately lateral joint pain but some pain beneath the patellofemoral joint with crepitation.  No instability.  Lacks almost 10 degrees to full knee extension flexed over 100 degrees.  No popliteal pain.  No calf pain.  Painless range of motion right hip.  Straight leg raise negative  Specialty Comments:  No specialty comments available.  Imaging: XR KNEE 3 VIEW RIGHT  Result Date: 12/18/2019 Films of the right knee were obtained in 3 projections standing.  There is bone-on-bone in the lateral compartment with subchondral sclerosis and large peripheral osteophytes.  Approximately 7 degrees of valgus.  Significant degenerative changes about the patellofemoral joint with large osteophytes and some lateral tilt.  Films are consistent with advanced osteoarthritis involving all 3 compartments but particularly laterally    PMFS History: Patient Active Problem List   Diagnosis Date Noted  . Morbid (severe) obesity due to excess calories (HCC) 12/18/2019  . Unilateral primary osteoarthritis, right knee 11/08/2016   Past Medical History:  Diagnosis Date  . Arthritis   . Diabetes mellitus without complication (HCC)   . Gout   . History of kidney stones   . Pneumonia    several years ago  . Pre-diabetes     History reviewed. No pertinent family history.  Past Surgical History:  Procedure Laterality Date  . CYSTOSCOPY/RETROGRADE/URETEROSCOPY/STONE EXTRACTION WITH BASKET Bilateral 08/29/2016   Procedure: CYSTOSCOPY/BILATERAL RETEROGRADE BILATERTAL URETEROSCOPY/STONE EXTRACTION WITH BASKET, RIGHT STENT PLACEMENT, HOLIUM LASER;  Surgeon: 08/31/2016, MD;  Location: [facility];   Service: Urology;  Laterality: Bilateral;  . KNEE SURGERY     x 4  . STONE EXTRACTION WITH BASKET    . STRABISMUS SURGERY Right 04/02/2015   Procedure: REPAIR STRABISMUS RIGHT EYE;  Surgeon: 04/04/2015, MD;  Location: [facility];  Service: Ophthalmology;  Laterality: Right;   Social History   Occupational History  . Not on file  Tobacco Use  . Smoking status: Never Smoker  . Smokeless tobacco: Never Used  Vaping Use  . Vaping Use: Never used  Substance and Sexual Activity  . Alcohol use: No  . Drug use: No  . Sexual activity: Not on file

## 2019-12-19 ENCOUNTER — Telehealth: Payer: Self-pay

## 2019-12-19 NOTE — Telephone Encounter (Signed)
Spoke with patient and Dr.Whitfield. I explained that cortisone can cause his blood pressure to elevate for a couple days. I recommended that he call his doctor that prescribes his blood pressure medicine and see if they recommend him do anything different for a couple days, per Dr.Whitfield.

## 2019-12-19 NOTE — Telephone Encounter (Signed)
Patient called stating that his BP has been high since receiving cortisone injection yesterday by Dr. Cleophas Dunker.  Stated that BP was 219/130, pulse 121 and that he is on BP medicine.  Stated that he called his heart doctor and advised them, but was told to contact our office, cause he may be having a reaction to the injection.  CB# [phone]. Please advise.  Thank you.

## 2019-12-23 ENCOUNTER — Telehealth: Payer: Self-pay

## 2019-12-23 NOTE — Telephone Encounter (Signed)
Ok for note-check with pt re where to send-thanks

## 2019-12-23 NOTE — Telephone Encounter (Signed)
Spoke with patient and faxed note as directed to number below.

## 2019-12-23 NOTE — Telephone Encounter (Signed)
Patient called he had an reaction to cortisone injection, it made his blood pressure high while he was at work he spoke to Dr.Whifield about it and he told him to stay out of work until BP gets back to correct level patient would like a note faxed to his job that excuses him from work he would like not faxed to his job .  Fax: [phone] Call back:[phone]

## 2019-12-23 NOTE — Telephone Encounter (Signed)
Please advise 

## 2020-03-30 ENCOUNTER — Other Ambulatory Visit: Payer: Self-pay

## 2020-03-30 ENCOUNTER — Ambulatory Visit (INDEPENDENT_AMBULATORY_CARE_PROVIDER_SITE_OTHER): Payer: BC Managed Care – PPO | Admitting: Orthopaedic Surgery

## 2020-03-30 ENCOUNTER — Encounter: Payer: Self-pay | Admitting: Orthopaedic Surgery

## 2020-03-30 VITALS — Ht 75.0 in | Wt 333.0 lb

## 2020-03-30 DIAGNOSIS — M25561 Pain in right knee: Secondary | ICD-10-CM | POA: Diagnosis not present

## 2020-03-30 DIAGNOSIS — G8929 Other chronic pain: Secondary | ICD-10-CM

## 2020-03-30 DIAGNOSIS — M1711 Unilateral primary osteoarthritis, right knee: Secondary | ICD-10-CM | POA: Diagnosis not present

## 2020-03-30 NOTE — Progress Notes (Signed)
Office Visit Note   Patient: [name] [name]           Date of Birth: 17-Dec-1970           MRN: [id number] Visit Date: 03/30/2020              Requested by: [facility], [facility] [address] [place],  [place] [address] PCP: [facility]   Assessment & Plan: Visit Diagnoses:  1. Chronic pain of right knee   2. Unilateral primary osteoarthritis, right knee     Plan: End-stage osteoarthritis right knee.  Bone-on-bone in the lateral compartment.  [name] [name] is really having a difficult time at work being on his feet up to 12 hours a day.  He is tried Tylenol, tramadol and some the NSAIDs without much relief.  He had a cortisone injection in September which provided him only temporary relief.  He is trying to lose weight but his BMI is still around 41.  His knees are not that large.  I am going to give him a note keeping him out of work until he can be seen by Dr. Magnus Ivan for consideration of right total knee replacement  Follow-Up Instructions: Return Refer to Dr. Magnus Ivan for consideration of total knee replacement.   Orders:  Orders Placed This Encounter  Procedures  . Ambulatory referral to Orthopedic Surgery   No orders of the defined types were placed in this encounter.     Procedures: No procedures performed   Clinical Data: No additional findings.   Subjective: Chief Complaint  Patient presents with  . Right Knee - Pain  Patient presents today for right knee pain. He was last here on 12/18/2019 and had his right knee injected with cortisone. He states that the injection only helped for a week. It caused him to have blood pressure issues. He is here today because his knee has progressed to the point it is very stiff and difficult to drive. He works in Secondary school teacher all day. He has had to leave work recently due to pain in his knee. He is trying to lose weight, but has not lost enough for surgery. He is in therapy to try and loosen his knee,  but states that he has lost a lot of muscle around the knee, He is taking Tylenol for pain. He has tried tramadol but states that it only makes him sleepy.  Has had multiple surgeries on that knee in the past.  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  HENT:     Mouth/Throat:     Mouth: Oropharynx is clear and moist.  Eyes:     Extraocular Movements: EOM normal.     Pupils: Pupils are equal, round, and reactive to light.  Pulmonary:     Effort: Pulmonary effort is normal.  Skin:    General: Skin is warm and dry.  Neurological:     Mental Status: He is alert and oriented to person, place, and time.  Psychiatric:        Mood and Affect: Mood and affect normal.        Behavior: Behavior normal.     Ortho Exam awake alert and oriented x3.  Comfortable  sitting.  Full extension right knee with very minimal effusion.  Knee was not particularly warm.  No instability.  Increased valgus with weightbearing.  Multiple incisions from prior surgeries.  No distal edema.  Motor exam appears to be intact.  He flexed about 90 to 95 degrees Specialty Comments:  No specialty comments available.  Imaging: No results found.   PMFS History: Patient Active Problem List   Diagnosis Date Noted  . Morbid (severe) obesity due to excess calories (HCC) 12/18/2019  . Unilateral primary osteoarthritis, right knee 11/08/2016   Past Medical History:  Diagnosis Date  . Arthritis   . Diabetes mellitus without complication (HCC)   . Gout   . History of kidney stones   . Pneumonia    several years ago  . Pre-diabetes     History reviewed. No pertinent family history.  Past Surgical History:  Procedure Laterality Date  . CYSTOSCOPY/RETROGRADE/URETEROSCOPY/STONE EXTRACTION  WITH BASKET Bilateral 08/29/2016   Procedure: CYSTOSCOPY/BILATERAL RETEROGRADE BILATERTAL URETEROSCOPY/STONE EXTRACTION WITH BASKET, RIGHT STENT PLACEMENT, HOLIUM LASER;  Surgeon: Bjorn Pippin, MD;  Location: [facility];  Service: Urology;  Laterality: Bilateral;  . KNEE SURGERY     x 4  . STONE EXTRACTION WITH BASKET    . STRABISMUS SURGERY Right 04/02/2015   Procedure: REPAIR STRABISMUS RIGHT EYE;  Surgeon: Verne Carrow, MD;  Location: [facility];  Service: Ophthalmology;  Laterality: Right;   Social History   Occupational History  . Not on file  Tobacco Use  . Smoking status: Never Smoker  . Smokeless tobacco: Never Used  Vaping Use  . Vaping Use: Never used  Substance and Sexual Activity  . Alcohol use: No  . Drug use: No  . Sexual activity: Not on file

## 2020-04-13 ENCOUNTER — Ambulatory Visit (INDEPENDENT_AMBULATORY_CARE_PROVIDER_SITE_OTHER): Payer: BC Managed Care – PPO | Admitting: Orthopaedic Surgery

## 2020-04-13 ENCOUNTER — Encounter: Payer: Self-pay | Admitting: Orthopaedic Surgery

## 2020-04-13 DIAGNOSIS — M1711 Unilateral primary osteoarthritis, right knee: Secondary | ICD-10-CM

## 2020-04-13 NOTE — Progress Notes (Signed)
Patient was seen and examined today but the epic system was down so this is a no charge visit.  I cannot review x-rays or the patient's chart.  He does have debilitating arthritis involving his right knee.  He also is a diabetic and reports his last hemoglobin A1c of over 7.5.  He is going to reschedule to come see [place] so I can go over his x-rays and talk to him more about the potential for knee replacement surgery in the future.  His BMI is also 42.

## 2020-07-06 ENCOUNTER — Ambulatory Visit (INDEPENDENT_AMBULATORY_CARE_PROVIDER_SITE_OTHER): Payer: BC Managed Care – PPO | Admitting: Orthopaedic Surgery

## 2020-07-06 VITALS — Ht 75.0 in | Wt 338.0 lb

## 2020-07-06 DIAGNOSIS — M25561 Pain in right knee: Secondary | ICD-10-CM

## 2020-07-06 DIAGNOSIS — M1711 Unilateral primary osteoarthritis, right knee: Secondary | ICD-10-CM

## 2020-07-06 DIAGNOSIS — G8929 Other chronic pain: Secondary | ICD-10-CM | POA: Diagnosis not present

## 2020-07-06 NOTE — Progress Notes (Signed)
---------------------------------------------------------------------------------------------------------------------------------------------------------------------------------------------------------------------------------------------------------------------------------------------------------------------------------------------------------  Office Visit Note   Patient: [name] [name]           Date of Birth: [birth year]           MRN: [id number] Visit Date: 07/06/2020              Requested by: [facility] [address] [place],  [place] [address] PCP: [facility]   Assessment & Plan: Visit Diagnoses:  1. Unilateral primary osteoarthritis, right knee   2. Chronic pain of right knee   3. Severe obesity (BMI >= 40) (HCC)     Plan: The patient has tried and failed conservative treatment for his right knee for well over 5 years now.  Having had 5 surgeries and multiple steroid injections combined with hyaluronic acid injections, weight loss, anti-inflammatories, activity modification and quad strengthening exercises with therapy, none of this is worked at this standpoint and he is at the point of wishing to proceed with knee replacement surgery.  I showed him a knee replacement model and went over his x-rays.  We described in detail the risks and benefits of this type of surgery as well.  Since he wants to wait till later in the year he will give [place] a call when is getting closer to when he is thinking about surgery so we can see him back in the office again.  He will send [place] his lab work as well.  All questions and concerns were answered and addressed.  Follow-Up Instructions: Return if symptoms worsen or fail to improve.   Orders:  No orders of the defined types were placed in this encounter.  No orders of the defined types were placed in this encounter.     Procedures: No procedures performed   Clinical Data: No additional findings.   Subjective: Chief  Complaint  Patient presents with  . Right Knee - Pain  Patient is a very pleasant 50 year old gentleman who comes in for evaluation treatment of known end-stage arthritis of his right knee.  He is actually referred from Dr. Cleophas Dunker.  He has had 5 operations over the years on that right knee that all stem from the injury when he was very young.  He is a very tall individual and has a BMI of 42.  He is a diabetic but does report that he had better control than he did about 2 years ago when his hemoglobin A1c was over 9.  He does have a lot of blood work he said coming up this Friday.  He says his last hemoglobin A1c was 7.1.  His right knee is malaligned.  It does affect his gait detrimentally and his quality of life.  He does wish to proceed with knee replacement surgery after having dealing with knee pain for many years now he has had multiple steroid injections as well as hyaluronic acid injections.  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  He is work on weight loss.  At this point his pain is 10 out of 10 and is detriment affecting his mobility, his quality of life and his actives daily living.  He cannot have surgery until the summer reported in his year due to being in a new job.  HPI  Review of Systems He currently denies a headache, chest pain, shortness of breath, fever, chills, nausea, vomiting  Objective: Vital Signs: Ht 6\' 3"  (1.905 m)   Wt (!) 338 lb (153.3 kg)   BMI 42.25  kg/m   Physical Exam He is alert and orient x3 and in no acute distress Ortho Exam Examination of his right knee shows a significant valgus deformity.  He lacks full extension but several degrees in full flexion by several degrees.  There is multiple healed incisions around the knee itself.  The knee is ligamentously lax as well. Specialty Comments:  No specialty comments available.  Imaging: No results found. Recent x-rays of the right knee show severe tricompartment arthritis with  valgus malalignment.  There is complete loss of the lateral joint space and large particular osteophytes in all 3 compartments.  There is severe patellofemoral disease.  PMFS History: Patient Active Problem List   Diagnosis Date Noted  . Morbid (severe) obesity due to excess calories (HCC) 12/18/2019  . Unilateral primary osteoarthritis, right knee 11/08/2016   Past Medical History:  Diagnosis Date  . Arthritis   . Diabetes mellitus without complication (HCC)   . Gout   . History of kidney stones   . Pneumonia    several years ago  . Pre-diabetes     No family history on file.  Past Surgical History:  Procedure Laterality Date  . CYSTOSCOPY/RETROGRADE/URETEROSCOPY/STONE EXTRACTION WITH BASKET Bilateral 08/29/2016   Procedure: CYSTOSCOPY/BILATERAL RETEROGRADE BILATERTAL URETEROSCOPY/STONE EXTRACTION WITH BASKET, RIGHT STENT PLACEMENT, HOLIUM LASER;  Surgeon: Bjorn Pippin, MD;  Location: [facility];  Service: Urology;  Laterality: Bilateral;  . KNEE SURGERY     x 4  . STONE EXTRACTION WITH BASKET    . STRABISMUS SURGERY Right 04/02/2015   Procedure: REPAIR STRABISMUS RIGHT EYE;  Surgeon: Verne Carrow, MD;  Location: [facility];  Service: Ophthalmology;  Laterality: Right;   Social History   Occupational History  . Not on file  Tobacco Use  . Smoking status: Never Smoker  . Smokeless tobacco: Never Used  Vaping Use  . Vaping Use: Never used  Substance and Sexual Activity  . Alcohol use: No  . Drug use: No  . Sexual activity: Not on file

## 2020-09-13 ENCOUNTER — Encounter: Payer: Self-pay | Admitting: Orthopaedic Surgery

## 2020-09-13 ENCOUNTER — Ambulatory Visit (INDEPENDENT_AMBULATORY_CARE_PROVIDER_SITE_OTHER): Payer: BC Managed Care – PPO | Admitting: Orthopaedic Surgery

## 2020-09-13 ENCOUNTER — Other Ambulatory Visit: Payer: Self-pay

## 2020-09-13 VITALS — Ht 75.0 in | Wt 333.2 lb

## 2020-09-13 DIAGNOSIS — M1711 Unilateral primary osteoarthritis, right knee: Secondary | ICD-10-CM | POA: Diagnosis not present

## 2020-09-13 DIAGNOSIS — M25561 Pain in right knee: Secondary | ICD-10-CM | POA: Diagnosis not present

## 2020-09-13 DIAGNOSIS — G8929 Other chronic pain: Secondary | ICD-10-CM | POA: Diagnosis not present

## 2020-09-13 NOTE — Progress Notes (Signed)
The patient is well-known to me.  He has significant debilitating arthritis involving his right knee with significant valgus malalignment.  He has had 5 different surgeries on that knee.  We have recommended knee replacement surgery in the past.  At this point his pain is daily with the right knee and it is detrimentally affecting his mobility, his quality of life and his actives daily living.  He is ready to proceed with knee replacement surgery.  We have talked to him about this in the past.  I went over his x-rays again of the right knee today that shows severe end-stage arthritis of the right knee with valgus malalignment.  There are large osteophytes in all 3 compartments as well.  He denies any acute change in medical status.  He denies any headache, chest pain, shortness of breath, fever, chills, nausea, vomiting.  Examination of his right knee does show mild effusion.  There is significant valgus malalignment and pain throughout the arc of motion of the knee in all 3 compartments.  There is severe patellofemoral crepitation.  At this point we will proceed with knee replacement surgery.  I have shown him a knee model and explained in detail what this type of surgery involves in the past.  We discussed the risks and benefits of surgery and what to expect with his intraoperative and postoperative course.  He said his pain is so bad now he needs a note to keep him out of work until further notice and until we can proceed with surgery.  He understands that we are usually booked out for about 4 weeks.  We will work on getting this scheduled in the near future.  All questions and concerns were answered and addressed.

## 2020-09-14 DIAGNOSIS — I208 Other forms of angina pectoris: Secondary | ICD-10-CM | POA: Insufficient documentation

## 2020-09-15 ENCOUNTER — Telehealth: Payer: Self-pay | Admitting: Orthopaedic Surgery

## 2020-09-15 NOTE — Telephone Encounter (Signed)
Received 5 medical records release forms,5 disability forms and $125.00 cash from patient    Patient said will pick up all but one form  Which is [employer]. Patient requested that form be faxed to [facility] group.    Forwarding to [employer] today.

## 2020-09-27 ENCOUNTER — Other Ambulatory Visit: Payer: Self-pay

## 2020-09-29 ENCOUNTER — Telehealth: Payer: Self-pay

## 2020-09-29 NOTE — Telephone Encounter (Signed)
Completed and faxed back

## 2020-09-29 NOTE — Telephone Encounter (Signed)
Brandy from [employer] called she stated she faxed over a physicians statement she is requesting the form to be completed and sent back asap

## 2020-10-05 ENCOUNTER — Telehealth: Payer: Self-pay | Admitting: Physician Assistant

## 2020-10-05 NOTE — Telephone Encounter (Signed)
Received $25.00 cash,medical records release form and disability paperwork from patient/Forwarding to [employer] today  

## 2020-10-15 NOTE — Progress Notes (Signed)
Need orders in epic.  Surgery on 7/22.  Proep on 10/22/20.

## 2020-10-19 ENCOUNTER — Other Ambulatory Visit: Payer: Self-pay | Admitting: Physician Assistant

## 2020-10-19 NOTE — Progress Notes (Signed)
DUE TO COVID-19 ONLY ONE VISITOR IS ALLOWED TO COME WITH YOU AND STAY IN THE WAITING ROOM ONLY DURING PRE OP AND PROCEDURE DAY OF SURGERY. THE 1 VISITOR  MAY VISIT WITH YOU AFTER SURGERY IN YOUR PRIVATE ROOM DURING VISITING HOURS ONLY!  YOU NEED TO HAVE A COVID 19 TEST ON___v7/19/2022 ____ @_______ , THIS TEST MUST BE DONE BEFORE SURGERY,  COVID TESTING SITE [address] [place] , IT IS ON THE RIGHT GOING OUT [address] APPROXIMATELY  2 MINUTES PAST [employer] ON THE RIGHT. ONCE YOUR COVID TEST IS COMPLETED,  PLEASE BEGIN THE QUARANTINE INSTRUCTIONS AS OUTLINED IN YOUR HANDOUT.                ANISH VANA  10/19/2020   Your procedure is scheduled on:   10/29/20   Report to [facility] Main  Entrance   Report to admitting at    1100AM     Call this number if you have problems the morning of surgery [phone]    REMEMBER: NO  SOLID FOOD CANDY OR GUM AFTER MIDNIGHT. CLEAR LIQUIDS UNTIL    1030am        . NOTHING BY MOUTH EXCEPT CLEAR LIQUIDS UNTIL    . PLEASE FINISH G2 Lower sugar DRINK PER SURGEON ORDER  WHICH NEEDS TO BE COMPLETED AT       1030am    .      CLEAR LIQUID DIET   Foods Allowed                                                                    Coffee and tea, regular and decaf                            Fruit ices (not with fruit pulp)                                      Iced Popsicles                                    Carbonated beverages, regular and diet                                    Cranberry, grape and apple juices Sports drinks like Gatorade Lightly seasoned clear broth or consume(fat free) Sugar, honey syrup ___________________________________________________________________      BRUSH YOUR TEETH MORNING OF SURGERY AND RINSE YOUR MOUTH OUT, NO CHEWING GUM CANDY OR MINTS.     Take these medicines the morning of surgery with A SIP OF WATER:    none   DO NOT TAKE ANY DIABETIC MEDICATIONS DAY OF YOUR  SURGERY                               You may not have any metal on your body including hair pins and  piercings  Do not wear jewelry, make-up, lotions, powders or perfumes, deodorant             Do not wear nail polish on your fingernails.  Do not shave  48 hours prior to surgery.              Men may shave face and neck.   Do not bring valuables to the hospital. [place].  Contacts, dentures or bridgework may not be worn into surgery.  Leave suitcase in the car. After surgery it may be brought to your room.     Patients discharged the day of surgery will not be allowed to drive home. IF YOU ARE HAVING SURGERY AND GOING HOME THE SAME DAY, YOU MUST HAVE AN ADULT TO DRIVE YOU HOME AND BE WITH YOU FOR 24 HOURS. YOU MAY GO HOME BY TAXI OR UBER OR ORTHERWISE, BUT AN ADULT MUST ACCOMPANY YOU HOME AND STAY WITH YOU FOR 24 HOURS.  Name and phone number of your driver:  Special Instructions: N/A              Please read over the following fact sheets you were given: _____________________________________________________________________  [facility] - Preparing for Surgery Before surgery, you can play an important role.  Because skin is not sterile, your skin needs to be as free of germs as possible.  You can reduce the number of germs on your skin by washing with CHG (chlorahexidine gluconate) soap before surgery.  CHG is an antiseptic cleaner which kills germs and bonds with the skin to continue killing germs even after washing. Please DO NOT use if you have an allergy to CHG or antibacterial soaps.  If your skin becomes reddened/irritated stop using the CHG and inform your nurse when you arrive at Short Stay. Do not shave (including legs and underarms) for at least 48 hours prior to the first CHG shower.  You may shave your face/neck. Please follow these instructions carefully:  1.  Shower with CHG Soap the night before surgery and the   morning of Surgery.  2.  If you choose to wash your hair, wash your hair first as usual with your  normal  shampoo.  3.  After you shampoo, rinse your hair and body thoroughly to remove the  shampoo.                           4.  Use CHG as you would any other liquid soap.  You can apply chg directly  to the skin and wash                       Gently with a scrungie or clean washcloth.  5.  Apply the CHG Soap to your body ONLY FROM THE NECK DOWN.   Do not use on face/ open                           Wound or open sores. Avoid contact with eyes, ears mouth and genitals (private parts).                       Wash face,  Genitals (private parts) with your normal soap.             6.  Wash thoroughly, paying special attention to the area where your surgery  will be performed.  7.  Thoroughly rinse your body with warm water from the neck down.  8.  DO NOT shower/wash with your normal soap after using and rinsing off  the CHG Soap.                9.  Pat yourself dry with a clean towel.            10.  Wear clean pajamas.            11.  Place clean sheets on your bed the night of your first shower and do not  sleep with pets. Day of Surgery : Do not apply any lotions/deodorants the morning of surgery.  Please wear clean clothes to the hospital/surgery center.  FAILURE TO FOLLOW THESE INSTRUCTIONS MAY RESULT IN THE CANCELLATION OF YOUR SURGERY PATIENT SIGNATURE_________________________________  NURSE SIGNATURE__________________________________  ________________________________________________________________________

## 2020-10-22 ENCOUNTER — Telehealth: Payer: Self-pay | Admitting: Orthopaedic Surgery

## 2020-10-22 ENCOUNTER — Other Ambulatory Visit: Payer: Self-pay

## 2020-10-22 ENCOUNTER — Encounter (HOSPITAL_COMMUNITY)
Admission: RE | Admit: 2020-10-22 | Discharge: 2020-10-22 | Disposition: A | Discharge status: Home / Self Care | Payer: BC Managed Care – PPO | Source: Ambulatory Visit | Attending: Orthopaedic Surgery | Admitting: Orthopaedic Surgery

## 2020-10-22 ENCOUNTER — Encounter (HOSPITAL_COMMUNITY): Payer: Self-pay

## 2020-10-22 DIAGNOSIS — Z01812 Encounter for preprocedural laboratory examination: Secondary | ICD-10-CM | POA: Diagnosis not present

## 2020-10-22 HISTORY — DX: Gastro-esophageal reflux disease without esophagitis: K21.9

## 2020-10-22 HISTORY — DX: Essential (primary) hypertension: I10

## 2020-10-22 LAB — BASIC METABOLIC PANEL
Anion gap: 11 (ref 5–15)
BUN: 16 mg/dL (ref 6–20)
CO2: 26 mmol/L (ref 22–32)
Calcium: 9.7 mg/dL (ref 8.9–10.3)
Chloride: 100 mmol/L (ref 98–111)
Creatinine, Ser: 1.07 mg/dL (ref 0.61–1.24)
GFR, Estimated: >60 mL/min (ref >60)
Glucose, Bld: 158 mg/dL — ABNORMAL HIGH (ref 70–99)
Potassium: 4.2 mmol/L (ref 3.5–5.1)
Sodium: 137 mmol/L (ref 135–145)

## 2020-10-22 LAB — SURGICAL PCR SCREEN
MRSA, PCR: NEGATIVE
Staphylococcus aureus: NEGATIVE

## 2020-10-22 LAB — CBC
HCT: 46 % (ref 39.0–52.0)
Hemoglobin: 15.4 g/dL (ref 13.0–17.0)
MCH: 29.1 pg (ref 26.0–34.0)
MCHC: 33.5 g/dL (ref 30.0–36.0)
MCV: 86.8 fL (ref 80.0–100.0)
Platelets: 231 10*3/uL (ref 150–400)
RBC: 5.3 MIL/uL (ref 4.22–5.81)
RDW: 12.4 % (ref 11.5–15.5)
WBC: 6.2 10*3/uL (ref 4.0–10.5)
nRBC: 0 % (ref 0.0–0.2)

## 2020-10-22 LAB — GLUCOSE, CAPILLARY: Glucose-Capillary: 142 mg/dL — ABNORMAL HIGH (ref 70–99)

## 2020-10-22 NOTE — Progress Notes (Addendum)
Anesthesia Review:  PCP: DR Bunnie Pion with [facility] in [place]  Cardiologist : DR Assar LOV 09/14/20 OV note on chart  Chest x-ray : EKG : 09/14/20 on chart  Echo : 2020 on chart  Stress test: Cardiac Cath :  Activity level: cannot do a flight of stairs without difficulty  Sleep Study/ CPAP : Sleep study is being set up but not scheduled yet  Fasting Blood Sugar :      / Checks Blood Sugar -- times a day:   Blood Thinner/ Instructions /Last Dose: ASA / Instructions/ Last Dose :  81 mg Aspirin - pt was instructed to call surgeon regarding instructions of Aspirin prior to surgeyr   Hgba1c-09/09/20-  7.2 on chart  DM- type 2  Checks glucose occasionally per pt

## 2020-10-22 NOTE — Telephone Encounter (Signed)
Patient submitted medical release form. 2 separate short term disabilities, and $50.00 cash payment. Accepted 10/22/20

## 2020-10-22 NOTE — Telephone Encounter (Signed)
Pt asking if he still needs to continue taking the baby Asprin prior to his surgery or if/when he needs to stop. He went for pre op today but they could not tell him so he wanted to make sure. The best call back number is [phone].

## 2020-10-22 NOTE — Telephone Encounter (Signed)
Called pt and informed. He stated understanding  

## 2020-10-26 ENCOUNTER — Other Ambulatory Visit (HOSPITAL_COMMUNITY)
Admission: RE | Admit: 2020-10-26 | Discharge: 2020-10-26 | Disposition: A | Discharge status: Home / Self Care | Payer: BC Managed Care – PPO | Source: Ambulatory Visit | Attending: Orthopaedic Surgery | Admitting: Orthopaedic Surgery

## 2020-10-26 DIAGNOSIS — Z20822 Contact with and (suspected) exposure to covid-19: Secondary | ICD-10-CM | POA: Insufficient documentation

## 2020-10-26 DIAGNOSIS — Z01812 Encounter for preprocedural laboratory examination: Secondary | ICD-10-CM | POA: Diagnosis not present

## 2020-10-26 LAB — SARS CORONAVIRUS 2 (TAT 6-24 HRS): SARS Coronavirus 2: NEGATIVE

## 2020-10-27 ENCOUNTER — Telehealth: Payer: Self-pay | Admitting: Orthopaedic Surgery

## 2020-10-27 NOTE — Telephone Encounter (Signed)
Can you put this in orders when he has sx?

## 2020-10-27 NOTE — Telephone Encounter (Signed)
Pt asking to get post surgery P.T orders sent to [facility] [phone]- [address] [place], [place] [address]). The best call back number for the pt is [phone].

## 2020-10-27 NOTE — Telephone Encounter (Signed)
Is he one of yours?

## 2020-10-28 NOTE — Telephone Encounter (Signed)
I called and talked to the pt. He said someone called him from [facility] and said they cancelled his order because he already had OP PT set up for Monday.   Number to OP PT and contact person is Dyanne Iha [phone]

## 2020-10-28 NOTE — H&P (Signed)
TOTAL KNEE ADMISSION H&P  Patient is being admitted for right total knee arthroplasty.  Subjective:  Chief Complaint:right knee pain.  HPI: [name] [name], 50 y.o. male, has a history of pain and functional disability in the right knee due to arthritis and has failed non-surgical conservative treatments for greater than 12 weeks to includeNSAID's and/or analgesics, corticosteriod injections, viscosupplementation injections, flexibility and strengthening excercises, use of assistive devices, weight reduction as appropriate, and activity modification.  Onset of symptoms was gradual, starting >10 years ago with gradually worsening course since that time. The patient noted prior procedures on the knee to include  arthroscopy and menisectomy on the right knee(s).  Patient currently rates pain in the right knee(s) at 10 out of 10 with activity. Patient has night pain, worsening of pain with activity and weight bearing, pain that interferes with activities of daily living, pain with passive range of motion, crepitus, and joint swelling.  Patient has evidence of subchondral sclerosis, periarticular osteophytes, and joint space narrowing by imaging studies. There is no active infection.  Patient Active Problem List   Diagnosis Date Noted   Morbid (severe) obesity due to excess calories (HCC) 12/18/2019   Unilateral primary osteoarthritis, right knee 11/08/2016   Past Medical History:  Diagnosis Date   Arthritis    Diabetes mellitus without complication (HCC)    GERD (gastroesophageal reflux disease)    Gout    History of kidney stones    Hypertension    Pneumonia    several years ago    Past Surgical History:  Procedure Laterality Date   CYSTOSCOPY/RETROGRADE/URETEROSCOPY/STONE EXTRACTION WITH BASKET Bilateral 08/29/2016   Procedure: CYSTOSCOPY/BILATERAL RETEROGRADE BILATERTAL URETEROSCOPY/STONE EXTRACTION WITH BASKET, RIGHT STENT PLACEMENT, HOLIUM LASER;  Surgeon: Bjorn Pippin, MD;  Location: [facility];  Service: Urology;  Laterality: Bilateral;   KNEE SURGERY     x 4   STONE EXTRACTION WITH BASKET     STRABISMUS SURGERY Right 04/02/2015   Procedure: REPAIR STRABISMUS RIGHT EYE;  Surgeon: Verne Carrow, MD;  Location: [facility];  Service: Ophthalmology;  Laterality: Right;    No current facility-administered medications for this encounter.   Current Outpatient Medications  Medication Sig Dispense Refill Last Dose   acetaminophen (TYLENOL) 650 MG CR tablet Take 1,300 mg by mouth every 8 (eight) hours as needed for pain.      amLODipine (NORVASC) 5 MG tablet Take 5 mg by mouth daily.      aspirin EC 81 MG tablet Take 81 mg by mouth daily. Swallow whole.      atorvastatin (LIPITOR) 20 MG tablet Take 20 mg by mouth daily.      fluticasone (FLONASE) 50 MCG/ACT nasal spray Place 1 spray into both nostrils 2 (two) times daily as needed for allergies or rhinitis.      metFORMIN (GLUCOPHAGE) 500 MG tablet Take 1,000 mg by mouth at bedtime.      metoprolol succinate (TOPROL-XL) 25 MG 24 hr tablet Take 12.5 mg by mouth daily.      Allergies  Allergen Reactions   Penicillins Other (See Comments)    Has patient had a PCN reaction causing immediate rash, facial/tongue/throat swelling, SOB or lightheadedness with hypotension: Unknown Has patient had a PCN reaction causing severe rash involving mucus membranes or skin necrosis: Unknown Has patient had a PCN reaction that required hospitalization: Unknown Has patient had a PCN reaction occurring within the last 10 years: No Childhood reaction If all of the above answers are "NO", then may proceed  with Cephalosporin use.     Social History   Tobacco Use   Smoking status: Never   Smokeless tobacco: Never  Substance Use Topics   Alcohol use: No    No family history on file.   Review of Systems  Musculoskeletal:  Positive for gait problem and joint swelling.  All other systems reviewed and are  negative.  Objective:  Physical Exam Vitals reviewed.  Constitutional:      Appearance: Normal appearance.  HENT:     Head: Normocephalic and atraumatic.  Eyes:     Extraocular Movements: Extraocular movements intact.     Pupils: Pupils are equal, round, and reactive to light.  Cardiovascular:     Rate and Rhythm: Normal rate and regular rhythm.  Pulmonary:     Effort: Pulmonary effort is normal.     Breath sounds: Normal breath sounds.  Abdominal:     Palpations: Abdomen is soft.  Musculoskeletal:     Cervical back: Normal range of motion and neck supple.     Right knee: Effusion, bony tenderness and crepitus present. Decreased range of motion. Tenderness present over the medial joint line, lateral joint line and patellar tendon. Abnormal alignment and abnormal meniscus.  Neurological:     Mental Status: He is alert and oriented to person, place, and time.  Psychiatric:        Behavior: Behavior normal.    Vital signs in last 24 hours:    Labs:   Estimated body mass index is 40.9 kg/m as calculated from the following:   Height as of 10/22/20: 6\' 4"  (1.93 m).   Weight as of 10/22/20: 152.4 kg.   Imaging Review Plain radiographs demonstrate severe degenerative joint disease of the right knee(s). The overall alignment ismild varus. The bone quality appears to be excellent for age and reported activity level.      Assessment/Plan:  End stage arthritis, right knee   The patient history, physical examination, clinical judgment of the provider and imaging studies are consistent with end stage degenerative joint disease of the right knee(s) and total knee arthroplasty is deemed medically necessary. The treatment options including medical management, injection therapy arthroscopy and arthroplasty were discussed at length. The risks and benefits of total knee arthroplasty were presented and reviewed. The risks due to aseptic loosening, infection, stiffness, patella tracking  problems, thromboembolic complications and other imponderables were discussed. The patient acknowledged the explanation, agreed to proceed with the plan and consent was signed. Patient is being admitted for inpatient treatment for surgery, pain control, PT, OT, prophylactic antibiotics, VTE prophylaxis, progressive ambulation and ADL's and discharge planning. The patient is planning to be discharged home with home health services

## 2020-10-28 NOTE — Telephone Encounter (Signed)
I called [name] [name] and she will reorder [facility] for pt. Pt will r/s his Outpatient PT for two weeks out from surgery. He understands HH should call him back to get this set up

## 2020-10-28 NOTE — Telephone Encounter (Signed)
I tried calling [name] [name] to discuss this but got disconnected. FYI since pt surgery is tomorrow

## 2020-10-29 ENCOUNTER — Other Ambulatory Visit: Payer: Self-pay

## 2020-10-29 ENCOUNTER — Observation Stay (HOSPITAL_COMMUNITY): Payer: BC Managed Care – PPO

## 2020-10-29 ENCOUNTER — Encounter (HOSPITAL_COMMUNITY): Payer: Self-pay | Admitting: Orthopaedic Surgery

## 2020-10-29 ENCOUNTER — Ambulatory Visit (HOSPITAL_COMMUNITY): Payer: BC Managed Care – PPO | Admitting: Physician Assistant

## 2020-10-29 ENCOUNTER — Ambulatory Visit (HOSPITAL_COMMUNITY): Payer: BC Managed Care – PPO | Admitting: Anesthesiology

## 2020-10-29 ENCOUNTER — Encounter (HOSPITAL_COMMUNITY)
Admission: RE | Disposition: A | Discharge status: Home / Self Care | Payer: Self-pay | Source: Home / Self Care | Attending: Orthopaedic Surgery

## 2020-10-29 ENCOUNTER — Observation Stay (HOSPITAL_COMMUNITY)
Admission: RE | Admit: 2020-10-29 | Discharge: 2020-10-30 | Disposition: A | Discharge status: Home / Self Care | Payer: BC Managed Care – PPO | Attending: Orthopaedic Surgery | Admitting: Orthopaedic Surgery

## 2020-10-29 DIAGNOSIS — M1711 Unilateral primary osteoarthritis, right knee: Secondary | ICD-10-CM | POA: Diagnosis not present

## 2020-10-29 DIAGNOSIS — Z79899 Other long term (current) drug therapy: Secondary | ICD-10-CM | POA: Diagnosis not present

## 2020-10-29 DIAGNOSIS — Z7984 Long term (current) use of oral hypoglycemic drugs: Secondary | ICD-10-CM | POA: Diagnosis not present

## 2020-10-29 DIAGNOSIS — E119 Type 2 diabetes mellitus without complications: Secondary | ICD-10-CM | POA: Insufficient documentation

## 2020-10-29 DIAGNOSIS — I10 Essential (primary) hypertension: Secondary | ICD-10-CM | POA: Insufficient documentation

## 2020-10-29 DIAGNOSIS — M23006 Cystic meniscus, unspecified meniscus, right knee: Secondary | ICD-10-CM | POA: Diagnosis present

## 2020-10-29 DIAGNOSIS — Z7982 Long term (current) use of aspirin: Secondary | ICD-10-CM | POA: Insufficient documentation

## 2020-10-29 DIAGNOSIS — Z96651 Presence of right artificial knee joint: Secondary | ICD-10-CM

## 2020-10-29 HISTORY — PX: TOTAL KNEE ARTHROPLASTY: SHX125

## 2020-10-29 LAB — TYPE AND SCREEN
ABO/RH(D): O NEG
Antibody Screen: NEGATIVE

## 2020-10-29 LAB — GLUCOSE, CAPILLARY
Glucose-Capillary: 151 mg/dL — ABNORMAL HIGH (ref 70–99)
Glucose-Capillary: 148 mg/dL — ABNORMAL HIGH (ref 70–99)
Glucose-Capillary: 311 mg/dL — ABNORMAL HIGH (ref 70–99)

## 2020-10-29 LAB — ABO/RH: ABO/RH(D): O NEG

## 2020-10-29 SURGERY — ARTHROPLASTY, KNEE, TOTAL
Anesthesia: Monitor Anesthesia Care | Site: Knee | Laterality: Right

## 2020-10-29 MED ORDER — TRANEXAMIC ACID-NACL 1000-0.7 MG/100ML-% IV SOLN
1000.0000 mg | INTRAVENOUS | Status: AC
  Administered 2020-10-29: 1000 mg via INTRAVENOUS
  Filled 2020-10-29: qty 100

## 2020-10-29 MED ORDER — METHOCARBAMOL 500 MG PO TABS: 500.0000 mg | ORAL_TABLET | Freq: Four times a day (QID) | ORAL | Status: DC | PRN

## 2020-10-29 MED ORDER — BUPIVACAINE LIPOSOME 1.3 % IJ SUSP
INTRAMUSCULAR | Status: DC | PRN
  Administered 2020-10-29: 10 mL via PERINEURAL

## 2020-10-29 MED ORDER — DIPHENHYDRAMINE HCL 12.5 MG/5ML PO ELIX: 12.5000 mg | ORAL_SOLUTION | ORAL | Status: DC | PRN

## 2020-10-29 MED ORDER — MIDAZOLAM HCL 2 MG/2ML IJ SOLN
INTRAMUSCULAR | Status: AC
  Filled 2020-10-29: qty 2

## 2020-10-29 MED ORDER — AMLODIPINE BESYLATE 5 MG PO TABS
5.0000 mg | ORAL_TABLET | Freq: Every day | ORAL | Status: DC
  Administered 2020-10-30: 5 mg via ORAL
  Filled 2020-10-29: qty 1

## 2020-10-29 MED ORDER — OXYCODONE HCL 5 MG PO TABS
10.0000 mg | ORAL_TABLET | ORAL | Status: DC | PRN
  Administered 2020-10-30: 15 mg via ORAL
  Filled 2020-10-29 (×2): qty 3

## 2020-10-29 MED ORDER — ACETAMINOPHEN 160 MG/5ML PO SOLN: 325.0000 mg | ORAL | Status: DC | PRN

## 2020-10-29 MED ORDER — METOCLOPRAMIDE HCL 5 MG PO TABS: 5.0000 mg | ORAL_TABLET | Freq: Three times a day (TID) | ORAL | Status: DC | PRN

## 2020-10-29 MED ORDER — METOPROLOL SUCCINATE ER 25 MG PO TB24
12.5000 mg | ORAL_TABLET | Freq: Every day | ORAL | Status: DC
  Administered 2020-10-29: 12.5 mg via ORAL
  Filled 2020-10-29 (×2): qty 1

## 2020-10-29 MED ORDER — SODIUM CHLORIDE 0.9 % IR SOLN
Status: DC | PRN
  Administered 2020-10-29: 1000 mL

## 2020-10-29 MED ORDER — HYDROMORPHONE HCL 1 MG/ML IJ SOLN
1.0000 mg | INTRAMUSCULAR | Status: DC | PRN
  Administered 2020-10-29: 2 mg via INTRAVENOUS
  Filled 2020-10-29: qty 2

## 2020-10-29 MED ORDER — MENTHOL 3 MG MT LOZG: 1.0000 | LOZENGE | OROMUCOSAL | Status: DC | PRN

## 2020-10-29 MED ORDER — POVIDONE-IODINE 10 % EX SWAB
2.0000 "application " | Freq: Once | CUTANEOUS | Status: AC
  Administered 2020-10-29: 2 via TOPICAL

## 2020-10-29 MED ORDER — CLINDAMYCIN PHOSPHATE 900 MG/50ML IV SOLN
900.0000 mg | INTRAVENOUS | Status: AC
  Administered 2020-10-29: 900 mg via INTRAVENOUS
  Filled 2020-10-29: qty 50

## 2020-10-29 MED ORDER — MIDAZOLAM HCL 2 MG/2ML IJ SOLN
1.0000 mg | INTRAMUSCULAR | Status: DC
  Administered 2020-10-29: 2 mg via INTRAVENOUS
  Filled 2020-10-29: qty 2

## 2020-10-29 MED ORDER — BUPIVACAINE HCL 0.5 % IJ SOLN
INTRAMUSCULAR | Status: DC | PRN
  Administered 2020-10-29: 15 mL

## 2020-10-29 MED ORDER — MEPERIDINE HCL 50 MG/ML IJ SOLN: 6.2500 mg | INTRAMUSCULAR | Status: DC | PRN

## 2020-10-29 MED ORDER — ONDANSETRON HCL 4 MG/2ML IJ SOLN: 4.0000 mg | Freq: Four times a day (QID) | INTRAMUSCULAR | Status: DC | PRN

## 2020-10-29 MED ORDER — ASPIRIN 81 MG PO CHEW
81.0000 mg | CHEWABLE_TABLET | Freq: Two times a day (BID) | ORAL | Status: DC
  Administered 2020-10-29 – 2020-10-30 (×2): 81 mg via ORAL
  Filled 2020-10-29 (×2): qty 1

## 2020-10-29 MED ORDER — METOCLOPRAMIDE HCL 5 MG/ML IJ SOLN: 5.0000 mg | Freq: Three times a day (TID) | INTRAMUSCULAR | Status: DC | PRN

## 2020-10-29 MED ORDER — MIDAZOLAM HCL 5 MG/5ML IJ SOLN
INTRAMUSCULAR | Status: DC | PRN
  Administered 2020-10-29: 2 mg via INTRAVENOUS

## 2020-10-29 MED ORDER — BUPIVACAINE IN DEXTROSE 0.75-8.25 % IT SOLN
INTRATHECAL | Status: DC | PRN
  Administered 2020-10-29: 2 mL via INTRATHECAL

## 2020-10-29 MED ORDER — METHOCARBAMOL 500 MG IVPB - SIMPLE MED
500.0000 mg | Freq: Four times a day (QID) | INTRAVENOUS | Status: DC | PRN
  Filled 2020-10-29: qty 50

## 2020-10-29 MED ORDER — ONDANSETRON HCL 4 MG/2ML IJ SOLN: 4.0000 mg | Freq: Once | INTRAMUSCULAR | Status: DC | PRN

## 2020-10-29 MED ORDER — FENTANYL CITRATE (PF) 100 MCG/2ML IJ SOLN: 25.0000 ug | INTRAMUSCULAR | Status: DC | PRN

## 2020-10-29 MED ORDER — PANTOPRAZOLE SODIUM 40 MG PO TBEC
40.0000 mg | DELAYED_RELEASE_TABLET | Freq: Every day | ORAL | Status: DC
  Administered 2020-10-29 – 2020-10-30 (×2): 40 mg via ORAL
  Filled 2020-10-29 (×2): qty 1

## 2020-10-29 MED ORDER — ATORVASTATIN CALCIUM 20 MG PO TABS
20.0000 mg | ORAL_TABLET | Freq: Every day | ORAL | Status: DC
  Administered 2020-10-30: 20 mg via ORAL
  Filled 2020-10-29: qty 1

## 2020-10-29 MED ORDER — SODIUM CHLORIDE 0.9 % IV SOLN: INTRAVENOUS | Status: DC

## 2020-10-29 MED ORDER — PHENOL 1.4 % MT LIQD: 1.0000 | OROMUCOSAL | Status: DC | PRN

## 2020-10-29 MED ORDER — OXYCODONE HCL 5 MG PO TABS
5.0000 mg | ORAL_TABLET | ORAL | Status: DC | PRN
Start: 2020-10-29 — End: 2020-10-30
  Administered 2020-10-29: 5 mg via ORAL
  Administered 2020-10-30 (×2): 10 mg via ORAL
  Filled 2020-10-29: qty 2
  Filled 2020-10-29: qty 1
  Filled 2020-10-29: qty 2

## 2020-10-29 MED ORDER — DEXAMETHASONE SODIUM PHOSPHATE 10 MG/ML IJ SOLN
INTRAMUSCULAR | Status: AC
  Filled 2020-10-29: qty 1

## 2020-10-29 MED ORDER — LACTATED RINGERS IV SOLN: INTRAVENOUS | Status: DC

## 2020-10-29 MED ORDER — METFORMIN HCL 500 MG PO TABS
1000.0000 mg | ORAL_TABLET | Freq: Every day | ORAL | Status: DC
  Administered 2020-10-29: 1000 mg via ORAL
  Filled 2020-10-29: qty 2

## 2020-10-29 MED ORDER — ACETAMINOPHEN 325 MG PO TABS: 325.0000 mg | ORAL_TABLET | ORAL | Status: DC | PRN

## 2020-10-29 MED ORDER — KETOROLAC TROMETHAMINE 15 MG/ML IJ SOLN
15.0000 mg | Freq: Four times a day (QID) | INTRAMUSCULAR | Status: AC
  Administered 2020-10-29 – 2020-10-30 (×4): 15 mg via INTRAVENOUS
  Filled 2020-10-29 (×4): qty 1

## 2020-10-29 MED ORDER — STERILE WATER FOR IRRIGATION IR SOLN
Status: DC | PRN
  Administered 2020-10-29: 2000 mL

## 2020-10-29 MED ORDER — ACETAMINOPHEN 325 MG PO TABS: 325.0000 mg | ORAL_TABLET | Freq: Four times a day (QID) | ORAL | Status: DC | PRN

## 2020-10-29 MED ORDER — FENTANYL CITRATE (PF) 100 MCG/2ML IJ SOLN
50.0000 ug | INTRAMUSCULAR | Status: DC
  Administered 2020-10-29: 100 ug via INTRAVENOUS
  Filled 2020-10-29: qty 2

## 2020-10-29 MED ORDER — ONDANSETRON HCL 4 MG/2ML IJ SOLN
INTRAMUSCULAR | Status: DC | PRN
  Administered 2020-10-29: 4 mg via INTRAVENOUS

## 2020-10-29 MED ORDER — ONDANSETRON HCL 4 MG PO TABS: 4.0000 mg | ORAL_TABLET | Freq: Four times a day (QID) | ORAL | Status: DC | PRN

## 2020-10-29 MED ORDER — CLINDAMYCIN PHOSPHATE 600 MG/50ML IV SOLN
600.0000 mg | Freq: Four times a day (QID) | INTRAVENOUS | Status: AC
  Administered 2020-10-29 – 2020-10-30 (×2): 600 mg via INTRAVENOUS
  Filled 2020-10-29 (×2): qty 50

## 2020-10-29 MED ORDER — DOCUSATE SODIUM 100 MG PO CAPS
100.0000 mg | ORAL_CAPSULE | Freq: Two times a day (BID) | ORAL | Status: DC
  Administered 2020-10-29 – 2020-10-30 (×2): 100 mg via ORAL
  Filled 2020-10-29 (×3): qty 1

## 2020-10-29 MED ORDER — OXYCODONE HCL 5 MG/5ML PO SOLN: 5.0000 mg | Freq: Once | ORAL | Status: DC | PRN

## 2020-10-29 MED ORDER — ALUM & MAG HYDROXIDE-SIMETH 200-200-20 MG/5ML PO SUSP: 30.0000 mL | ORAL | Status: DC | PRN

## 2020-10-29 MED ORDER — ONDANSETRON HCL 4 MG/2ML IJ SOLN
INTRAMUSCULAR | Status: AC
  Filled 2020-10-29: qty 2

## 2020-10-29 MED ORDER — CHLORHEXIDINE GLUCONATE 0.12 % MT SOLN
15.0000 mL | Freq: Once | OROMUCOSAL | Status: AC
  Administered 2020-10-29: 15 mL via OROMUCOSAL

## 2020-10-29 MED ORDER — PROPOFOL 500 MG/50ML IV EMUL
INTRAVENOUS | Status: DC | PRN
  Administered 2020-10-29: 75 ug/kg/min via INTRAVENOUS

## 2020-10-29 MED ORDER — PROPOFOL 10 MG/ML IV BOLUS
INTRAVENOUS | Status: DC | PRN
  Administered 2020-10-29: 30 mg via INTRAVENOUS
  Administered 2020-10-29: 20 mg via INTRAVENOUS

## 2020-10-29 MED ORDER — ORAL CARE MOUTH RINSE
15.0000 mL | Freq: Once | OROMUCOSAL | Status: AC
Start: 2020-10-29 — End: 2020-10-29

## 2020-10-29 MED ORDER — DEXAMETHASONE SODIUM PHOSPHATE 10 MG/ML IJ SOLN
INTRAMUSCULAR | Status: DC | PRN
  Administered 2020-10-29: 10 mg via INTRAVENOUS

## 2020-10-29 MED ORDER — 0.9 % SODIUM CHLORIDE (POUR BTL) OPTIME
TOPICAL | Status: DC | PRN
  Administered 2020-10-29: 1000 mL

## 2020-10-29 MED ORDER — OXYCODONE HCL 5 MG PO TABS
5.0000 mg | ORAL_TABLET | Freq: Once | ORAL | Status: DC | PRN
Start: 2020-10-29 — End: 2020-10-29

## 2020-10-29 SURGICAL SUPPLY — 55 items
BAG COUNTER SPONGE SURGICOUNT (BAG) IMPLANT
BAG ZIPLOCK 12X15 (MISCELLANEOUS) ×2 IMPLANT
BENZOIN TINCTURE PRP APPL 2/3 (GAUZE/BANDAGES/DRESSINGS) IMPLANT
BLADE SAG 18X100X1.27 (BLADE) ×2 IMPLANT
BLADE SURG SZ10 CARB STEEL (BLADE) ×4 IMPLANT
BNDG ELASTIC 6X10 VLCR STRL LF (GAUZE/BANDAGES/DRESSINGS) ×2 IMPLANT
BNDG ELASTIC 6X5.8 VLCR STR LF (GAUZE/BANDAGES/DRESSINGS) ×4 IMPLANT
BOWL SMART MIX CTS (DISPOSABLE) IMPLANT
COOLER ICEMAN CLASSIC (MISCELLANEOUS) ×2 IMPLANT
COVER SURGICAL LIGHT HANDLE (MISCELLANEOUS) ×2 IMPLANT
CUFF TOURN SGL QUICK 34 (TOURNIQUET CUFF) ×2
CUFF TRNQT CYL 34X4.125X (TOURNIQUET CUFF) ×1 IMPLANT
DECANTER SPIKE VIAL GLASS SM (MISCELLANEOUS) IMPLANT
DRAPE U-SHAPE 47X51 STRL (DRAPES) ×2 IMPLANT
DRSG PAD ABDOMINAL 8X10 ST (GAUZE/BANDAGES/DRESSINGS) ×2 IMPLANT
DURAPREP 26ML APPLICATOR (WOUND CARE) ×2 IMPLANT
ELECT BLADE TIP CTD 4 INCH (ELECTRODE) ×2 IMPLANT
ELECT REM PT RETURN 15FT ADLT (MISCELLANEOUS) ×2 IMPLANT
FEMORAL POSTERIOR SZ6 RT (Femur) ×1 IMPLANT
GAUZE SPONGE 4X4 12PLY STRL (GAUZE/BANDAGES/DRESSINGS) ×2 IMPLANT
GAUZE XEROFORM 1X8 LF (GAUZE/BANDAGES/DRESSINGS) ×2 IMPLANT
GLOVE SRG 8 PF TXTR STRL LF DI (GLOVE) ×2 IMPLANT
GLOVE SURG ENC MOIS LTX SZ7.5 (GLOVE) ×2 IMPLANT
GLOVE SURG LTX SZ8 (GLOVE) ×2 IMPLANT
GLOVE SURG UNDER POLY LF SZ8 (GLOVE) ×4
GOWN STRL REUS W/TWL XL LVL3 (GOWN DISPOSABLE) ×4 IMPLANT
HANDPIECE INTERPULSE COAX TIP (DISPOSABLE) ×2
HOLDER FOLEY CATH W/STRAP (MISCELLANEOUS) IMPLANT
IMMOBILIZER KNEE 20 (SOFTGOODS) ×2
IMMOBILIZER KNEE 20 THIGH 36 (SOFTGOODS) ×1 IMPLANT
IMMOBILIZER KNEE 22 UNIV (SOFTGOODS) ×2 IMPLANT
INSERT TBL BEARNG SZ5 10 KNEE (Miscellaneous) ×2 IMPLANT
KIT TURNOVER KIT A (KITS) ×2 IMPLANT
KNEE PATELLA ASYMMETRIC 10X32 (Knees) ×2 IMPLANT
KNEE TIBIAL COMPONENT SZ5 (Knees) ×2 IMPLANT
NS IRRIG 1000ML POUR BTL (IV SOLUTION) ×2 IMPLANT
PACK TOTAL KNEE CUSTOM (KITS) ×2 IMPLANT
PAD COLD SHLDR WRAP-ON (PAD) ×2 IMPLANT
PADDING CAST COTTON 6X4 STRL (CAST SUPPLIES) ×2 IMPLANT
PENCIL SMOKE EVACUATOR (MISCELLANEOUS) IMPLANT
PIN FLUTED HEDLESS FIX 3.5X1/8 (PIN) ×2 IMPLANT
POSTERIOR FEMORAL SZ6 RT (Femur) ×2 IMPLANT
PROTECTOR NERVE ULNAR (MISCELLANEOUS) ×2 IMPLANT
SET HNDPC FAN SPRY TIP SCT (DISPOSABLE) ×1 IMPLANT
SET PAD KNEE POSITIONER (MISCELLANEOUS) ×2 IMPLANT
STAPLER VISISTAT 35W (STAPLE) IMPLANT
STRIP CLOSURE SKIN 1/2X4 (GAUZE/BANDAGES/DRESSINGS) IMPLANT
SUT MNCRL AB 4-0 PS2 18 (SUTURE) IMPLANT
SUT VIC AB 0 CT1 27 (SUTURE) ×2
SUT VIC AB 0 CT1 27XBRD ANTBC (SUTURE) ×1 IMPLANT
SUT VIC AB 1 CT1 36 (SUTURE) ×4 IMPLANT
SUT VIC AB 2-0 CT1 27 (SUTURE) ×4
SUT VIC AB 2-0 CT1 TAPERPNT 27 (SUTURE) ×2 IMPLANT
TRAY FOLEY MTR SLVR 16FR STAT (SET/KITS/TRAYS/PACK) IMPLANT
WATER STERILE IRR 1000ML POUR (IV SOLUTION) ×4 IMPLANT

## 2020-10-29 NOTE — Anesthesia Postprocedure Evaluation (Signed)
Anesthesia Post Note  Patient: [name] [name]  Procedure(s) Performed: RIGHT TOTAL KNEE ARTHROPLASTY (Right: Knee)     Patient location during evaluation: PACU Anesthesia Type: MAC Level of consciousness: oriented and awake and alert Pain management: pain level controlled Vital Signs Assessment: post-procedure vital signs reviewed and stable Respiratory status: spontaneous breathing, respiratory function stable and patient connected to nasal cannula oxygen Cardiovascular status: blood pressure returned to baseline and stable Postop Assessment: no headache, no backache and no apparent nausea or vomiting Anesthetic complications: no   No notable events documented.  Last Vitals:  Vitals:   10/29/20 1329 10/29/20 1535  BP:  128/77  Pulse: 79   Resp: 15   Temp:    SpO2: 99%     Last Pain:  Vitals:   10/29/20 1322  TempSrc:   PainSc: 0-No pain                 Taevon Aschoff

## 2020-10-29 NOTE — Brief Op Note (Signed)
10/29/2020  3:16 PM  PATIENT:  [name]  50 y.o. male  PRE-OPERATIVE DIAGNOSIS:  osteoarthritis right knee  POST-OPERATIVE DIAGNOSIS:  osteoarthritis right knee  PROCEDURE:  Procedure(s): RIGHT TOTAL KNEE ARTHROPLASTY (Right)  SURGEON:  Surgeon(s) and Role:    Kathryne Hitch, MD - Primary  PHYSICIAN ASSISTANT:  Rexene Edison, PA-C  ANESTHESIA:   regional and spinal  EBL:  200 mL   COUNTS:  YES  TOURNIQUET:   Total Tourniquet Time Documented: Thigh (Right) - 49 minutes Total: Thigh (Right) - 49 minutes   DICTATION: .Other Dictation: Dictation Number [id number]  PLAN OF CARE: Admit for overnight observation  PATIENT DISPOSITION:  PACU - hemodynamically stable.   Delay start of Pharmacological VTE agent (>24hrs) due to surgical blood loss or risk of bleeding: no

## 2020-10-29 NOTE — Op Note (Signed)
NAME: [name] [name], [name] [name]. MEDICAL RECORD NO: [id number] ACCOUNT NO: [id number] DATE OF BIRTH: 04-23-1970 FACILITY: [facility] LOCATION: [facility] PHYSICIAN: Vanita Panda. Magnus Ivan, MD  Operative Report   DATE OF PROCEDURE: 10/29/2020  PREOPERATIVE DIAGNOSIS:  Severe end-stage arthritis and degenerative joint disease, right knee.  POSTOPERATIVE DIAGNOSIS:  Severe end-stage arthritis and degenerative joint disease, right knee.  PROCEDURE:  Right total knee arthroplasty.  IMPLANTS:  Stryker Triathlon press-fit knee system with size 6 femur, size 5 tibial tray, 10 mm thickness fixed-bearing polyethylene insert, size 32 patellar button.  SURGEON:  Vanita Panda. Magnus Ivan, MD  ASSISTANT:  Rexene Edison, PA-C  ANESTHESIA: 1.  Right lower extremity adductor canal block. 2.  Spinal.  ANTIBIOTICS:  3 g IV Ancef.  BLOOD LOSS:  Less than 100 mL.  TOURNIQUET TIME:  Less than 1 hour.  COMPLICATIONS:  None.  INDICATIONS:  The patient is a 50 year old gentleman who is 6 feet 4 inches tall and has a BMI of 40.  He has had 5 different operations on his right knee over many years starting as a young person.  At this point, he has end-stage arthritis of his right  knee.  This has been demonstrated with clinical exam and x-rays.  He has tried and failed all forms of conservative treatment, and at this point, he has requested total knee arthroplasty and we agree with this given the severe end-stage arthritis of his  right knee on plain films and clinical exam.  We had a long and thorough discussion about the risk of acute blood loss anemia, nerve or vessel injury, fracture, infection, DVT, implant failure, and skin and soft tissue issues.  We talked about our goals  being decreased pain, improved mobility and overall improved quality of life.  DESCRIPTION OF PROCEDURE:  After informed consent was obtained, appropriate right knee was marked.  An adductor canal block was obtained in the right lower extremity in the  holding room.  He was then brought to the operating room and sat up on the  operating table.  Spinal anesthesia was obtained. He was laid in supine position on the operating table.  Foley catheter was placed, and a nonsterile tourniquet was placed around his upper right thigh.  Preoperatively, he does have a slight flexion  contracture and some valgus malalignment of that knee.  His right thigh, knee, leg, ankle, and foot were prepped and draped with DuraPrep and sterile drapes.  Timeout was called. He was identified as correct patient, correct right knee.  We then used an  Esmarch to wrap out the leg and tourniquet inflated to 300 mm pressure.  We then made a direct midline incision over the patella and carried this proximally and distally.  We dissected down the knee joint carrying out a medial parapatellar arthrotomy  finding a large joint effusion and significant osteophytes throughout the knee and complete loss of the cartilage.  We removed periarticular osteophytes from around the knee and removed remnants of ACL, PCL, medial and lateral meniscus.  With the knee in  a flexed position, we set our extramedullary cutting guide for taking 9 mm off the high side, correcting for varus and valgus and neutral slope.  We made this cut without difficulty.  We then went to the femur and made an intramedullary hole in the  notch for our distal femoral cutting guide setting this for a right knee at 5 degrees externally rotated.  We made this cut without difficulty as well and brought the knee back down  to full extension after making our 10 mm distal femoral  cut.  With a 9 mm extension block, we had achieved full extension.  We then went backed to the femur and put our femoral sizing guide based off the epicondylar axis and Whitesides line.  Based off of this, we chose a size 6 femur.  We put a 4-in-1  cutting block for a size 6 femur. We made our anterior and posterior cuts followed by our chamfer cuts.  We then  made our femoral box cut.  This was for a size 6 femur as well.  We then went back to the tibia and chose a size 5 tibial tray for coverage  based on what we could see and placed on the top of the tibial plateau setting the rotation of the tibial tubercle and the femur.  We then made our keel punch off of this.  With a size 5 trial tibia, we trialed a size 6 right femur and a 9 mm and then 10  mm polyethylene insert.  We were pleased with range of motion and stability with a 10 mm insert.  We then made our patellar cut and drilled three holes for a size 32 press-fit patellar button.  We then removed all instrumentation from the knee and  irrigated the knee with normal saline solution.  We dried it well after that.  We did finishing block on the tibia for our pegs for the baseplate of the tibia.  We then placed our Stryker Triathlon tibial press-fit baseplate size 5.  We placed our real  press-fit femur, which was a size 6 right femur.  We then placed our real 10 mm fixed-bearing polyethylene insert and press fit our size 32 patellar button. After we did this, we put the knee through several cycles of range of motion.  We were pleased  with range of motion and stability.  We then let the tourniquet down and hemostasis was obtained with electrocautery.  We then closed the arthrotomy with interrupted #1 Vicryl suture followed by 0 Vicryl to close the deep tissue and 2-0 Vicryl to close  the subcutaneous tissue.  The skin was reapproximated with staples.  Xeroform well-padded sterile dressing was applied.  He was taken to recovery room in stable condition with all final counts being correct.  No complications noted.  Of note, Rexene Edison, PA-C, did assist during the entire case.  His assistance was crucial for facilitating every aspect of this case.   ROH D: 10/29/2020 3:15:35 pm T: 10/29/2020 11:06:00 pm  JOB: [id number]/ [id number]

## 2020-10-29 NOTE — Transfer of Care (Signed)
Immediate Anesthesia Transfer of Care Note  Patient: [name] [name]  Procedure(s) Performed: RIGHT TOTAL KNEE ARTHROPLASTY (Right: Knee)  Patient Location: PACU  Anesthesia Type:Spinal  Level of Consciousness: awake, alert  and oriented  Airway & Oxygen Therapy: Patient Spontanous Breathing and Patient connected to face mask oxygen  Post-op Assessment: Report given to RN and Post -op Vital signs reviewed and stable  Post vital signs: Reviewed and stable  Last Vitals:  Vitals Value Taken Time  BP    Temp    Pulse    Resp    SpO2      Last Pain:  Vitals:   10/29/20 1322  TempSrc:   PainSc: 0-No pain      Patients Stated Pain Goal: 4 (10/29/20 1137)  Complications: No notable events documented.

## 2020-10-29 NOTE — Progress Notes (Signed)
Assisted Dr. Tacy Dura with right, ultrasound guided, adductor canal block. Side rails up, monitors on throughout procedure. See vital signs in flow sheet. Tolerated Procedure well. Exparal given also.

## 2020-10-29 NOTE — Plan of Care (Signed)
  Problem: Clinical Measurements: Goal: Ability to maintain clinical measurements within normal limits will improve Outcome: Progressing   Problem: Coping: Goal: Level of anxiety will decrease Outcome: Progressing   Problem: Elimination: Goal: Will not experience complications related to bowel motility Outcome: Progressing   Problem: Pain Managment: Goal: General experience of comfort will improve Outcome: Progressing   Problem: Safety: Goal: Ability to remain free from injury will improve Outcome: Progressing   Problem: Skin Integrity: Goal: Risk for impaired skin integrity will decrease Outcome: Progressing   

## 2020-10-29 NOTE — Anesthesia Preprocedure Evaluation (Addendum)
Anesthesia Evaluation  Patient identified by MRN, date of birth, ID band Patient awake    Reviewed: Allergy & Precautions, H&P , NPO status , Patient's Chart, lab work & pertinent test results, reviewed documented beta blocker date and time   Airway Mallampati: II  TM Distance: >3 FB Neck ROM: full    Dental no notable dental hx. (+) Teeth Intact, Dental Advisory Given,    Pulmonary neg pulmonary ROS,    Pulmonary exam normal breath sounds clear to auscultation       Cardiovascular Exercise Tolerance: Good hypertension, Pt. on medications and Pt. on home beta blockers  Rhythm:regular Rate:Normal     Neuro/Psych negative neurological ROS  negative psych ROS   GI/Hepatic Neg liver ROS, GERD  Medicated,  Endo/Other  diabetesMorbid obesity  Renal/GU negative Renal ROS  negative genitourinary   Musculoskeletal  (+) Arthritis , Osteoarthritis,    Abdominal   Peds  Hematology negative hematology ROS (+)   Anesthesia Other Findings   Reproductive/Obstetrics negative OB ROS                            Anesthesia Physical Anesthesia Plan  ASA: 3  Anesthesia Plan: MAC and Spinal   Post-op Pain Management: GA combined w/ Regional for post-op pain   Induction: Intravenous  PONV Risk Score and Plan: 1 and Propofol infusion, Ondansetron and Midazolam  Airway Management Planned: Natural Airway and Nasal Cannula  Additional Equipment: None  Intra-op Plan:   Post-operative Plan: Extubation in OR  Informed Consent: I have reviewed the patients History and Physical, chart, labs and discussed the procedure including the risks, benefits and alternatives for the proposed anesthesia with the patient or authorized representative who has indicated his/her understanding and acceptance.     Dental Advisory Given  Plan Discussed with: CRNA and Anesthesiologist  Anesthesia Plan Comments: (  )       Anesthesia Quick Evaluation

## 2020-10-29 NOTE — Anesthesia Procedure Notes (Signed)
Anesthesia Regional Block: Adductor canal block   Pre-Anesthetic Checklist: , timeout performed,  Correct Patient, Correct Site, Correct Laterality,  Correct Procedure, Correct Position, site marked,  Risks and benefits discussed,  Surgical consent,  Pre-op evaluation,  At surgeon's request and post-op pain management  Laterality: Right  Prep: chloraprep       Needles:  Injection technique: Single-shot  Needle Type: Echogenic Stimulator Needle     Needle Length: 5cm  Needle Gauge: 22     Additional Needles:   Procedures:, nerve stimulator,,, ultrasound used (permanent image in chart),,    Narrative:  Start time: 10/29/2020 1:10 PM End time: 10/29/2020 1:21 PM Injection made incrementally with aspirations every 5 mL.  Performed by: Personally  Anesthesiologist: Bethena Midget, MD  Additional Notes: Functioning IV was confirmed and monitors were applied.  A 44mm 22ga Arrow echogenic stimulator needle was used. Sterile prep and drape,hand hygiene and sterile gloves were used. Ultrasound guidance: relevant anatomy identified, needle position confirmed, local anesthetic spread visualized around nerve(s)., vascular puncture avoided.  Image printed for medical record. Negative aspiration and negative test dose prior to incremental administration of local anesthetic. The patient tolerated the procedure well.

## 2020-10-29 NOTE — Interval H&P Note (Signed)
History and Physical Interval Note: The patient is aware that he is here today for a right total knee replacement to treat his right knee arthritis.  There has been no acute or interval change in his medical status.  See recent H&P.  The risks and benefits of surgery been explained in detail and informed consent is obtained.  The right knee has been marked.  10/29/2020 12:23 PM  [name] [name]  has presented today for surgery, with the diagnosis of osteoarthritis right knee.  The various methods of treatment have been discussed with the patient and family. After consideration of risks, benefits and other options for treatment, the patient has consented to  Procedure(s): RIGHT TOTAL KNEE ARTHROPLASTY (Right) as a surgical intervention.  The patient's history has been reviewed, patient examined, no change in status, stable for surgery.  I have reviewed the patient's chart and labs.  Questions were answered to the patient's satisfaction.     Kathryne Hitch

## 2020-10-29 NOTE — Anesthesia Procedure Notes (Signed)
Spinal  Patient location during procedure: OR End time: 10/29/2020 1:44 PM Reason for block: surgical anesthesia Staffing Performed: other anesthesia staff  Anesthesiologist: Lewie Loron, MD Resident/CRNA: Enriqueta Shutter D, CRNA Preanesthetic Checklist Completed: patient identified, IV checked, site marked, risks and benefits discussed, surgical consent, monitors and equipment checked, pre-op evaluation and timeout performed Spinal Block Patient position: sitting Prep: DuraPrep Patient monitoring: heart rate, continuous pulse ox and blood pressure Approach: right paramedian Location: L3-4 Injection technique: single-shot Needle Needle type: Sprotte and Pencan  Needle gauge: 24 G Needle length: 9 cm Assessment Sensory level: T6 Events: CSF return Additional Notes

## 2020-10-30 DIAGNOSIS — M1711 Unilateral primary osteoarthritis, right knee: Secondary | ICD-10-CM | POA: Diagnosis not present

## 2020-10-30 MED ORDER — METHOCARBAMOL 500 MG PO TABS: 500.0000 mg | ORAL_TABLET | Freq: Four times a day (QID) | ORAL | 1 refills | Status: DC | PRN

## 2020-10-30 MED ORDER — OXYCODONE HCL 5 MG PO TABS: 5.0000 mg | ORAL_TABLET | ORAL | 0 refills | Status: DC | PRN

## 2020-10-30 MED ORDER — ASPIRIN 81 MG PO CHEW: 81.0000 mg | CHEWABLE_TABLET | Freq: Two times a day (BID) | ORAL | 0 refills | Status: DC

## 2020-10-30 NOTE — Progress Notes (Signed)
Subjective: 1 Day Post-Op Procedure(s) (LRB): RIGHT TOTAL KNEE ARTHROPLASTY (Right) Patient reports pain as moderate.    Objective: Vital signs in last 24 hours: Temp:  [97.4 F (36.3 C)-98.9 F (37.2 C)] 98.8 F (37.1 C) (07/23 0521) Pulse Rate:  [56-93] 88 (07/23 0521) Resp:  [10-24] 18 (07/23 0521) BP: (107-156)/(65-103) 113/77 (07/23 0521) SpO2:  [93 %-100 %] 96 % (07/23 0800) Weight:  [152.4 kg] 152.4 kg (07/22 1117)  Intake/Output from previous day: 07/22 0701 - 07/23 0700 In: 2576.6 [I.V.:2526.6; IV Piggyback:50] Out: 1525 [Urine:1325; Blood:200] Intake/Output this shift: No intake/output data recorded.  No results for input(s): HGB in the last 72 hours. No results for input(s): WBC, RBC, HCT, PLT in the last 72 hours. No results for input(s): NA, K, CL, CO2, BUN, CREATININE, GLUCOSE, CALCIUM in the last 72 hours. No results for input(s): LABPT, INR in the last 72 hours.  Sensation intact distally Intact pulses distally Dorsiflexion/Plantar flexion intact Incision: scant drainage No cellulitis present Compartment soft   Assessment/Plan: 1 Day Post-Op Procedure(s) (LRB): RIGHT TOTAL KNEE ARTHROPLASTY (Right) Up with therapy Discharge home with home health      [name] [name] 10/30/2020, 9:15 AM

## 2020-10-30 NOTE — Plan of Care (Signed)

## 2020-10-30 NOTE — Discharge Instructions (Signed)

## 2020-10-30 NOTE — TOC Transition Note (Addendum)
Transition of Care [facility]) - CM/SW Discharge Note   Patient Details  Name: [name] [name] MRN: [id number] Date of Birth: [birth year]  [facility]) CM/SW Contact:  Darleene Cleaver, LCSW Phone Number: 10/30/2020, 11:11 AM   Clinical Narrative:     Patient will be going home with home health through [facility].  CSW signing off please reconsult with any other social work needs, home health agency has been notified of planned discharge.  Patient will be discharging back home today.  Patient will need a heavy duty bedside commode, and a rolling walker.  CSW contacted [employer], and they will deliver heavy duty bedside commode to home, and the rolling walker to the room.   Final next level of care: Home w Home Health Services Barriers to Discharge: Barriers Resolved   Patient Goals and CMS Choice Patient states their goals for this hospitalization and ongoing recovery are:: To return back home with home health. CMS Medicare.gov Compare Post Acute Care list provided to:: Patient Choice offered to / list presented to : Patient  Discharge Placement  Home with home health PT.                     Discharge Plan and Services  Patient discharging home today.              DME Arranged: Dan Humphreys rolling DME Agency: Other - Comment Loyal Buba) Date DME Agency Contacted: 10/30/20 Time DME Agency Contacted: 1030 Representative spoke with at DME Agency: Vaughan Basta HH Arranged: PT HH Agency: [facility] Date HH Agency Contacted: 10/30/20 Time HH Agency Contacted: 1111 Representative spoke with at [facility] Agency: Barbara Cower  Social Determinants of Health (SDOH) Interventions     Readmission Risk Interventions No flowsheet data found.

## 2020-10-30 NOTE — Plan of Care (Signed)
  Problem: Education: Goal: Knowledge of General Education information will improve Description: Including pain rating scale, medication(s)/side effects and non-pharmacologic comfort measures Outcome: Progressing   Problem: Nutrition: Goal: Adequate nutrition will be maintained Outcome: Progressing   

## 2020-10-30 NOTE — Evaluation (Signed)
Physical Therapy Evaluation Patient Details Name: [name] [name] MRN: [id number] DOB: Oct 15, 1970 Today's Date: 10/30/2020   History of Present Illness  Pt s/p R TKR  Clinical Impression  Pt s/p R TKR and presents with decreased R LE strength/ROM and post op pain limiting functional mobility.  Pt performed HEP with assist and written instruction. Pt up to ambulate in halls and negotiated stairs.  Eager for [employer] home.    Follow Up Recommendations Home health PT;Follow surgeon's recommendation for DC plan and follow-up therapies    Equipment Recommendations  Rolling walker with 5" wheels    Recommendations for Other Services       Precautions / Restrictions Precautions Precautions: Knee Restrictions Weight Bearing Restrictions: No RLE Weight Bearing: Weight bearing as tolerated      Mobility  Bed Mobility Overal bed mobility: Needs Assistance Bed Mobility: Supine to Sit;Sit to Supine     Supine to sit: Min guard Sit to supine: Supervision        Transfers Overall transfer level: Needs assistance Equipment used: Rolling walker (2 wheeled) Transfers: Sit to/from Stand Sit to Stand: Min guard         General transfer comment: Steady assist with cues for LE management and use of UEs to self assist  Ambulation/Gait Ambulation/Gait assistance: Min guard;Supervision Gait Distance (Feet): 100 Feet Assistive device: Rolling walker (2 wheeled) Gait Pattern/deviations: Step-to pattern;Step-through pattern;Decreased step length - right;Decreased step length - left;Shuffle;Trunk flexed     General Gait Details: cues for posture, position from RW and initial sequence  Stairs Stairs: Yes Stairs assistance: Min assist Stair Management: No rails;One rail Right;Step to pattern;Forwards;With crutches Number of Stairs: 7 General stair comments: single step twice with RW, 5 steps with rail and crutch.  Cues for sequence and foot/AD placement  Wheelchair Mobility    Modified  Rankin (Stroke Patients Only)       Balance Overall balance assessment: Mild deficits observed, not formally tested                                           Pertinent Vitals/Pain Pain Assessment: 0-10 Pain Score: 5  Pain Location: R knee Pain Descriptors / Indicators: Aching;Sore Pain Intervention(s): Limited activity within patient's tolerance;Monitored during session;Premedicated before session;Ice applied    Home Living Family/patient expects to be discharged to:: Private residence Living Arrangements: Spouse/significant other Available Help at Discharge: Family Type of Home: House Home Access: Stairs to enter Entrance Stairs-Rails: Doctor, general practice of Steps: 3 Home Layout: Two level Home Equipment: Cane - single point;Cane - quad;Crutches      Prior Function Level of Independence: Independent               Hand Dominance        Extremity/Trunk Assessment   Upper Extremity Assessment Upper Extremity Assessment: Overall WFL for tasks assessed    Lower Extremity Assessment Lower Extremity Assessment: RLE deficits/detail RLE Deficits / Details: 2+/5 quads with AAROM at knee -8 - 60    Cervical / Trunk Assessment Cervical / Trunk Assessment: Normal  Communication   Communication: No difficulties  Cognition Arousal/Alertness: Awake/alert Behavior During Therapy: WFL for tasks assessed/performed Overall Cognitive Status: Within Functional Limits for tasks assessed  General Comments      Exercises Total Joint Exercises Ankle Circles/Pumps: AROM;15 reps;Supine;Both Quad Sets: AROM;Both;10 reps;Supine Heel Slides: AAROM;Right;15 reps;Supine Straight Leg Raises: AAROM;Right;15 reps;Supine   Assessment/Plan    PT Assessment Patient needs continued PT services  PT Problem List Decreased strength;Decreased range of motion;Decreased activity tolerance;Decreased  balance;Decreased mobility;Pain;Decreased knowledge of use of DME       PT Treatment Interventions DME instruction;Gait training;Stair training;Functional mobility training;Therapeutic activities;Therapeutic exercise;Patient/family education    PT Goals (Current goals can be found in the Care Plan section)  Acute Rehab PT Goals Patient Stated Goal: Regain IND PT Goal Formulation: All assessment and education complete, DC therapy Time For Goal Achievement: 10/30/20 Potential to Achieve Goals: Good    Frequency Min 1X/week   Barriers to discharge        Co-evaluation               AM-PAC PT "6 Clicks" Mobility  Outcome Measure Help needed turning from your back to your side while in a flat bed without using bedrails?: A Little Help needed moving from lying on your back to sitting on the side of a flat bed without using bedrails?: A Little Help needed moving to and from a bed to a chair (including a wheelchair)?: A Little Help needed standing up from a chair using your arms (e.g., wheelchair or bedside chair)?: A Little Help needed to walk in hospital room?: A Little Help needed climbing 3-5 steps with a railing? : A Little 6 Click Score: 18    End of Session Equipment Utilized During Treatment: Gait belt;Right knee immobilizer Activity Tolerance: Patient tolerated treatment well Patient left: in bed;with call bell/phone within reach;with family/visitor present;with nursing/sitter in room Nurse Communication: Mobility status PT Visit Diagnosis: Difficulty in walking, not elsewhere classified (R26.2)    Time: 0962-8366 PT Time Calculation (min) (ACUTE ONLY): 48 min   Charges:   PT Evaluation $PT Eval Low Complexity: 1 Low PT Treatments $Gait Training: 8-22 mins $Therapeutic Exercise: 8-22 mins        [name] [name] PT Acute Rehabilitation Services Pager [phone] Office [phone]   [name] [name] 10/30/2020, 3:55 PM

## 2020-10-31 NOTE — Discharge Summary (Signed)
Patient ID: [name] [name] MRN: [id number] DOB/AGE: 1970-11-14 50 y.o.  Admit date: 10/29/2020 Discharge date: 10/31/2020  Admission Diagnoses:  Principal Problem:   Unilateral primary osteoarthritis, right knee Active Problems:   Status post right knee replacement   Discharge Diagnoses:  Same  Past Medical History:  Diagnosis Date   Arthritis    Diabetes mellitus without complication (HCC)    GERD (gastroesophageal reflux disease)    Gout    History of kidney stones    Hypertension    Pneumonia    several years ago    Surgeries: Procedure(s): RIGHT TOTAL KNEE ARTHROPLASTY on 10/29/2020   Consultants:   Discharged Condition: Improved  Hospital Course: [name] [name] is an 50 y.o. male who was admitted 10/29/2020 for operative treatment ofUnilateral primary osteoarthritis, right knee. Patient has severe unremitting pain that affects sleep, daily activities, and work/hobbies. After pre-op clearance the patient was taken to the operating room on 10/29/2020 and underwent  Procedure(s): RIGHT TOTAL KNEE ARTHROPLASTY.    Patient was given perioperative antibiotics:  Anti-infectives (From admission, onward)    Start     Dose/Rate Route Frequency Ordered Stop   10/29/20 2000  clindamycin (CLEOCIN) IVPB 600 mg        600 mg 100 mL/hr over 30 Minutes Intravenous Every 6 hours 10/29/20 1738 10/30/20 0223   10/29/20 1115  clindamycin (CLEOCIN) IVPB 900 mg        900 mg 100 mL/hr over 30 Minutes Intravenous On call to O.R. 10/29/20 1113 10/29/20 1347        Patient was given sequential compression devices, early ambulation, and chemoprophylaxis to prevent DVT.  Patient benefited maximally from hospital stay and there were no complications.    Recent vital signs: Patient Vitals for the past 24 hrs:  BP Temp Temp src Pulse Resp SpO2  10/30/20 1300 (!) 125/57 98.6 F (37 C) Oral 61 18 99 %     Recent laboratory studies: No results for input(s): WBC, HGB, HCT, PLT, NA, K, CL,  CO2, BUN, CREATININE, GLUCOSE, INR, CALCIUM in the last 72 hours.  Invalid input(s): PT, 2   Discharge Medications:   Allergies as of 10/30/2020       Reactions   Penicillins Other (See Comments)   Has patient had a PCN reaction causing immediate rash, facial/tongue/throat swelling, SOB or lightheadedness with hypotension: Unknown Has patient had a PCN reaction causing severe rash involving mucus membranes or skin necrosis: Unknown Has patient had a PCN reaction that required hospitalization: Unknown Has patient had a PCN reaction occurring within the last 10 years: No Childhood reaction If all of the above answers are "NO", then may proceed with Cephalosporin use.        Medication List     STOP taking these medications    aspirin EC 81 MG tablet Replaced by: aspirin 81 MG chewable tablet       TAKE these medications    acetaminophen 650 MG CR tablet Commonly known as: TYLENOL Take 1,300 mg by mouth every 8 (eight) hours as needed for pain.   amLODipine 5 MG tablet Commonly known as: NORVASC Take 5 mg by mouth daily.   aspirin 81 MG chewable tablet Chew 1 tablet (81 mg total) by mouth 2 (two) times daily. Replaces: aspirin EC 81 MG tablet   atorvastatin 20 MG tablet Commonly known as: LIPITOR Take 20 mg by mouth daily.   fluticasone 50 MCG/ACT nasal spray Commonly known as: FLONASE Place 1 spray into both  nostrils 2 (two) times daily as needed for allergies or rhinitis.   metFORMIN 500 MG tablet Commonly known as: GLUCOPHAGE Take 1,000 mg by mouth at bedtime.   methocarbamol 500 MG tablet Commonly known as: ROBAXIN Take 1 tablet (500 mg total) by mouth every 6 (six) hours as needed for muscle spasms.   metoprolol succinate 25 MG 24 hr tablet Commonly known as: TOPROL-XL Take 12.5 mg by mouth daily.   oxyCODONE 5 MG immediate release tablet Commonly known as: Oxy IR/ROXICODONE Take 1-2 tablets (5-10 mg total) by mouth every 4 (four) hours as needed  for moderate pain (pain score 4-6).        Diagnostic Studies: DG Knee Right Port  Result Date: 10/29/2020 CLINICAL DATA:  Status post right total knee arthroplasty. EXAM: PORTABLE RIGHT KNEE - 1-2 VIEW COMPARISON:  None. FINDINGS: Right femoral, tibial patellar components appear to be well situated. Expected postoperative changes are seen involving the soft tissues anteriorly. IMPRESSION: Status post right total knee arthroplasty. Electronically Signed   By: Lupita Raider M.D.   On: 10/29/2020 16:19    Disposition: Discharge disposition: 01-Home or Self Care          Follow-up Information     Kathryne Hitch, MD Follow up in 2 week(s).   Specialty: Orthopedic Surgery Contact information: [address] [place] [address] [phone]                  Signed: Kathryne Hitch 10/31/2020, 9:19 AM

## 2020-11-01 ENCOUNTER — Encounter (HOSPITAL_COMMUNITY): Payer: Self-pay | Admitting: Orthopaedic Surgery

## 2020-11-02 NOTE — Discharge Summary (Signed)
Patient ID: [name] [name] MRN: [id number] DOB/AGE: May 26, 1970 50 y.o.  Admit date: 10/29/2020 Discharge date: 10/30/2020  Admission Diagnoses:  Principal Problem:   Unilateral primary osteoarthritis, right knee Active Problems:   Status post right knee replacement   Discharge Diagnoses:  Same  Past Medical History:  Diagnosis Date   Arthritis    Diabetes mellitus without complication (HCC)    GERD (gastroesophageal reflux disease)    Gout    History of kidney stones    Hypertension    Pneumonia    several years ago    Surgeries: Procedure(s): RIGHT TOTAL KNEE ARTHROPLASTY on 10/29/2020   Consultants:   Discharged Condition: Improved  Hospital Course: [name] [name] is an 50 y.o. male who was admitted 10/29/2020 for operative treatment ofUnilateral primary osteoarthritis, right knee. Patient has severe unremitting pain that affects sleep, daily activities, and work/hobbies. After pre-op clearance the patient was taken to the operating room on 10/29/2020 and underwent  Procedure(s): RIGHT TOTAL KNEE ARTHROPLASTY.    Patient was given perioperative antibiotics:  Anti-infectives (From admission, onward)    Start     Dose/Rate Route Frequency Ordered Stop   10/29/20 2000  clindamycin (CLEOCIN) IVPB 600 mg        600 mg 100 mL/hr over 30 Minutes Intravenous Every 6 hours 10/29/20 1738 10/30/20 0223   10/29/20 1115  clindamycin (CLEOCIN) IVPB 900 mg        900 mg 100 mL/hr over 30 Minutes Intravenous On call to O.R. 10/29/20 1113 10/29/20 1347        Patient was given sequential compression devices, early ambulation, and chemoprophylaxis to prevent DVT.  Patient benefited maximally from hospital stay and there were no complications.    Recent vital signs: No data found.   Recent laboratory studies: No results for input(s): WBC, HGB, HCT, PLT, NA, K, CL, CO2, BUN, CREATININE, GLUCOSE, INR, CALCIUM in the last 72 hours.  Invalid input(s): PT, 2   Discharge  Medications:   Allergies as of 10/30/2020       Reactions   Penicillins Other (See Comments)   Has patient had a PCN reaction causing immediate rash, facial/tongue/throat swelling, SOB or lightheadedness with hypotension: Unknown Has patient had a PCN reaction causing severe rash involving mucus membranes or skin necrosis: Unknown Has patient had a PCN reaction that required hospitalization: Unknown Has patient had a PCN reaction occurring within the last 10 years: No Childhood reaction If all of the above answers are "NO", then may proceed with Cephalosporin use.        Medication List     STOP taking these medications    aspirin EC 81 MG tablet Replaced by: aspirin 81 MG chewable tablet       TAKE these medications    acetaminophen 650 MG CR tablet Commonly known as: TYLENOL Take 1,300 mg by mouth every 8 (eight) hours as needed for pain.   amLODipine 5 MG tablet Commonly known as: NORVASC Take 5 mg by mouth daily.   aspirin 81 MG chewable tablet Chew 1 tablet (81 mg total) by mouth 2 (two) times daily. Replaces: aspirin EC 81 MG tablet   atorvastatin 20 MG tablet Commonly known as: LIPITOR Take 20 mg by mouth daily.   fluticasone 50 MCG/ACT nasal spray Commonly known as: FLONASE Place 1 spray into both nostrils 2 (two) times daily as needed for allergies or rhinitis.   metFORMIN 500 MG tablet Commonly known as: GLUCOPHAGE Take 1,000 mg by mouth at bedtime.  methocarbamol 500 MG tablet Commonly known as: ROBAXIN Take 1 tablet (500 mg total) by mouth every 6 (six) hours as needed for muscle spasms.   metoprolol succinate 25 MG 24 hr tablet Commonly known as: TOPROL-XL Take 12.5 mg by mouth daily.   oxyCODONE 5 MG immediate release tablet Commonly known as: Oxy IR/ROXICODONE Take 1-2 tablets (5-10 mg total) by mouth every 4 (four) hours as needed for moderate pain (pain score 4-6).        Diagnostic Studies: DG Knee Right Port  Result Date:  10/29/2020 CLINICAL DATA:  Status post right total knee arthroplasty. EXAM: PORTABLE RIGHT KNEE - 1-2 VIEW COMPARISON:  None. FINDINGS: Right femoral, tibial patellar components appear to be well situated. Expected postoperative changes are seen involving the soft tissues anteriorly. IMPRESSION: Status post right total knee arthroplasty. Electronically Signed   By: Lupita Raider M.D.   On: 10/29/2020 16:19    Disposition: Discharge disposition: 01-Home or Self Care          Follow-up Information     Kathryne Hitch, MD Follow up in 2 week(s).   Specialty: Orthopedic Surgery Contact information: [address] [place] [address] [phone]                  Signed: Kathryne Hitch 11/02/2020, 3:23 PM

## 2020-11-03 ENCOUNTER — Other Ambulatory Visit: Payer: Self-pay | Admitting: Orthopaedic Surgery

## 2020-11-03 ENCOUNTER — Telehealth: Payer: Self-pay | Admitting: Orthopaedic Surgery

## 2020-11-03 MED ORDER — OXYCODONE HCL 5 MG PO TABS: 5.0000 mg | ORAL_TABLET | ORAL | 0 refills | Status: DC | PRN

## 2020-11-03 NOTE — Telephone Encounter (Signed)
Patient called needing Rx refilled Oxycodone   The number to contact patient is [phone]

## 2020-11-10 ENCOUNTER — Telehealth: Payer: Self-pay | Admitting: Orthopaedic Surgery

## 2020-11-10 ENCOUNTER — Other Ambulatory Visit: Payer: Self-pay | Admitting: Physician Assistant

## 2020-11-10 MED ORDER — OXYCODONE HCL 5 MG PO TABS: 5.0000 mg | ORAL_TABLET | ORAL | 0 refills | Status: DC | PRN

## 2020-11-10 NOTE — Telephone Encounter (Signed)
Pt wanted to make sure he would be getting a CB when his refill has been sent in.   [phone]

## 2020-11-10 NOTE — Telephone Encounter (Signed)
Patient called needing Rx refilled Oxycodone. Patient said he is out of the pain medication. The number to contact patient is [phone]

## 2020-11-10 NOTE — Telephone Encounter (Signed)
Please advise 

## 2020-11-11 ENCOUNTER — Encounter: Payer: Self-pay | Admitting: Physician Assistant

## 2020-11-11 ENCOUNTER — Telehealth: Payer: Self-pay | Admitting: Orthopaedic Surgery

## 2020-11-11 ENCOUNTER — Other Ambulatory Visit: Payer: Self-pay

## 2020-11-11 ENCOUNTER — Ambulatory Visit (INDEPENDENT_AMBULATORY_CARE_PROVIDER_SITE_OTHER): Payer: BC Managed Care – PPO | Admitting: Physician Assistant

## 2020-11-11 DIAGNOSIS — Z96651 Presence of right artificial knee joint: Secondary | ICD-10-CM

## 2020-11-11 NOTE — Telephone Encounter (Signed)
[employer] forms received. To [employer]. °

## 2020-11-11 NOTE — Progress Notes (Signed)
HPI: [name] returns today 2-week status post right total knee arthroplasty.  He is overall doing well.  He is ambulating with a rolling walker.  No chest pain shortness of breath fevers chills.  He is on 81 mg aspirin twice daily.  He is on an 81 mg aspirin prior to surgery.  Physical exam: Right knee surgical incisions healing well.  Well approximated with staples.  No signs of action.  He has significant ecchymosis particularly down the medial aspect of the tibia.  Calf supple nontender.  Dorsiflexion plantarflexion of right ankle intact.  He lacks full extension of the right knee by approximately 7 degrees and flexes to near 90 degrees actively.  Impression: Status post right total knee arthroplasty 10/29/2020  Plan: He will transition to outpatient therapy with a therapist he knows in the evening.  Prescriptions given.  They will work on range of motion strengthening.  He is to work on scar tissue mobilization.  Staples were removed today Steri-Strips applied.  No driving for the next 4 weeks for total of 6 weeks.  He will follow-up with [place] in 1 month sooner if there is any questions or concerns.

## 2020-11-12 ENCOUNTER — Emergency Department (HOSPITAL_COMMUNITY): Payer: BC Managed Care – PPO

## 2020-11-12 ENCOUNTER — Other Ambulatory Visit: Payer: Self-pay

## 2020-11-12 ENCOUNTER — Emergency Department (HOSPITAL_COMMUNITY)
Admission: EM | Admit: 2020-11-12 | Discharge: 2020-11-12 | Disposition: A | Discharge status: Home / Self Care | Payer: BC Managed Care – PPO | Attending: Emergency Medicine | Admitting: Emergency Medicine

## 2020-11-12 ENCOUNTER — Ambulatory Visit (HOSPITAL_COMMUNITY): Payer: BC Managed Care – PPO

## 2020-11-12 ENCOUNTER — Encounter (HOSPITAL_COMMUNITY): Payer: Self-pay | Admitting: *Deleted

## 2020-11-12 ENCOUNTER — Telehealth: Payer: Self-pay

## 2020-11-12 ENCOUNTER — Telehealth: Payer: Self-pay | Admitting: Radiology

## 2020-11-12 ENCOUNTER — Telehealth: Payer: Self-pay | Admitting: Physician Assistant

## 2020-11-12 DIAGNOSIS — M79604 Pain in right leg: Secondary | ICD-10-CM

## 2020-11-12 DIAGNOSIS — X58XXXA Exposure to other specified factors, initial encounter: Secondary | ICD-10-CM | POA: Diagnosis not present

## 2020-11-12 DIAGNOSIS — R000 Tachycardia, unspecified: Secondary | ICD-10-CM | POA: Diagnosis not present

## 2020-11-12 DIAGNOSIS — Z20822 Contact with and (suspected) exposure to covid-19: Secondary | ICD-10-CM | POA: Insufficient documentation

## 2020-11-12 DIAGNOSIS — Z7984 Long term (current) use of oral hypoglycemic drugs: Secondary | ICD-10-CM | POA: Diagnosis not present

## 2020-11-12 DIAGNOSIS — Z7982 Long term (current) use of aspirin: Secondary | ICD-10-CM | POA: Insufficient documentation

## 2020-11-12 DIAGNOSIS — E860 Dehydration: Secondary | ICD-10-CM | POA: Diagnosis not present

## 2020-11-12 DIAGNOSIS — S8011XA Contusion of right lower leg, initial encounter: Secondary | ICD-10-CM | POA: Insufficient documentation

## 2020-11-12 DIAGNOSIS — R0600 Dyspnea, unspecified: Secondary | ICD-10-CM | POA: Diagnosis not present

## 2020-11-12 DIAGNOSIS — E119 Type 2 diabetes mellitus without complications: Secondary | ICD-10-CM | POA: Insufficient documentation

## 2020-11-12 DIAGNOSIS — Z96651 Presence of right artificial knee joint: Secondary | ICD-10-CM | POA: Insufficient documentation

## 2020-11-12 DIAGNOSIS — I10 Essential (primary) hypertension: Secondary | ICD-10-CM | POA: Insufficient documentation

## 2020-11-12 DIAGNOSIS — M7989 Other specified soft tissue disorders: Secondary | ICD-10-CM

## 2020-11-12 DIAGNOSIS — Z79899 Other long term (current) drug therapy: Secondary | ICD-10-CM | POA: Insufficient documentation

## 2020-11-12 LAB — BASIC METABOLIC PANEL
Anion gap: 10 (ref 5–15)
BUN: 18 mg/dL (ref 6–20)
CO2: 24 mmol/L (ref 22–32)
Calcium: 9.5 mg/dL (ref 8.9–10.3)
Chloride: 100 mmol/L (ref 98–111)
Creatinine, Ser: 1.03 mg/dL (ref 0.61–1.24)
GFR, Estimated: >60 mL/min (ref >60)
Glucose, Bld: 301 mg/dL — ABNORMAL HIGH (ref 70–99)
Potassium: 4 mmol/L (ref 3.5–5.1)
Sodium: 134 mmol/L — ABNORMAL LOW (ref 135–145)

## 2020-11-12 LAB — CBC
HCT: 39 % (ref 39.0–52.0)
Hemoglobin: 13.2 g/dL (ref 13.0–17.0)
MCH: 29.3 pg (ref 26.0–34.0)
MCHC: 33.8 g/dL (ref 30.0–36.0)
MCV: 86.5 fL (ref 80.0–100.0)
Platelets: 682 10*3/uL — ABNORMAL HIGH (ref 150–400)
RBC: 4.51 MIL/uL (ref 4.22–5.81)
RDW: 12.3 % (ref 11.5–15.5)
WBC: 10.9 10*3/uL — ABNORMAL HIGH (ref 4.0–10.5)
nRBC: 0 % (ref 0.0–0.2)

## 2020-11-12 LAB — TROPONIN I (HIGH SENSITIVITY)
Troponin I (High Sensitivity): 5 ng/L (ref <18)
Troponin I (High Sensitivity): 6 ng/L (ref <18)

## 2020-11-12 LAB — RESP PANEL BY RT-PCR (FLU A&B, COVID) ARPGX2
Influenza A by PCR: NEGATIVE
Influenza B by PCR: NEGATIVE
SARS Coronavirus 2 by RT PCR: NEGATIVE

## 2020-11-12 MED ORDER — SODIUM CHLORIDE 0.9 % IV BOLUS
1000.0000 mL | Freq: Once | INTRAVENOUS | Status: AC
  Administered 2020-11-12: 1000 mL via INTRAVENOUS

## 2020-11-12 MED ORDER — METOPROLOL SUCCINATE ER 25 MG PO TB24
12.5000 mg | ORAL_TABLET | Freq: Once | ORAL | Status: AC
  Administered 2020-11-12: 12.5 mg via ORAL
  Filled 2020-11-12: qty 1

## 2020-11-12 MED ORDER — LACTATED RINGERS IV BOLUS
1000.0000 mL | Freq: Once | INTRAVENOUS | Status: AC
  Administered 2020-11-12: 1000 mL via INTRAVENOUS

## 2020-11-12 MED ORDER — IOHEXOL 350 MG/ML SOLN
100.0000 mL | Freq: Once | INTRAVENOUS | Status: AC | PRN
  Administered 2020-11-12: 100 mL via INTRAVENOUS

## 2020-11-12 NOTE — ED Notes (Signed)
Patient got up to use a urinal and heart rate went up to 130 bpm. RN notified.

## 2020-11-12 NOTE — ED Triage Notes (Signed)
S/p right knee replacement 2 weeks ago. C/o feeling short of breath, states he was scheduled for ultrasound of right leg today at [facility] but was unable to make it there.

## 2020-11-12 NOTE — Telephone Encounter (Signed)
Pts ultrasound was negative for DVT.

## 2020-11-12 NOTE — Subjective & Objective (Addendum)
CC: DOE, concern for DVT HPI: 50 year old male with a history of diabetes on metformin, hypertension who presents to the ER today with concerns for dyspnea on exertion and possible DVT.  Patient is status post right total knee arthroplasty approximately 2 weeks ago.  Patient states that he has been recuperating at home.  He has noticed over the last 2 days, he becomes quite dyspneic with any sort of exertion.  He also developed some bruising and pain in his popliteal fossa.  He called the office today.  They had him scheduled for an ultrasound at [facility].  Patient states that he felt bad and directed his wife to take him to [facility].  In the ER, patient noted to be tachycardic when he walked in with initial heart rate of 149.  Room air sats were 100.  Patient was given 2 L of IV fluids.  He was still having some ambulatory tachycardia.  Lower extremity ultrasound was negative for DVT.  CTPA was negative for PE.  Due to his continued tachycardia, [facility] hospitalist consulted for admission.  Patient denies any chest pain.  No nausea no vomiting.  No diarrhea.  He does admit to exertional dyspnea.  Patient states that he has not been drinking a lot of liquids over the last several days.  Patient had a urinal at his bedside.  The urine was dark in color even after 2 L of IV fluids.  See picture.

## 2020-11-12 NOTE — Telephone Encounter (Signed)
I called and spoke with pt his self and he is aware of the appt, pt was very apologietic about his wife being so hateful.

## 2020-11-12 NOTE — Assessment & Plan Note (Signed)
I think pt's sinus tachycardia being driven by pt's near lack of fluid intake of the last 3 days. Pt states he has not been drinking very much liquid over the last 3 days because he was not thirsty. Pt's urine is dark yellow. Pt has received 2 L of IV with some improvement in his ambulatory HR. Will give him his evening dose of Toprol-XL and have him drink lots of water. If HR improves after increased water intake and po toprol, then I think he can be safely discharged to home. Pt's LE U/S negative for DVT and CTPA negative for PE.

## 2020-11-12 NOTE — Consult Note (Signed)
History and Physical    [name] [name] MGQ:[id number] DOB: April 01, 1971 DOA: 11/12/2020  PCP: [facility]   Patient coming from: Clinic urgent care  I have personally briefly reviewed patient's old medical records in [facility] [facility]  CC: DOE, concern for DVT HPI: 50 year old male with a history of diabetes on metformin, hypertension who presents to the ER today with concerns for dyspnea on exertion and possible DVT.  Patient is status post right total knee arthroplasty approximately 2 weeks ago.  Patient states that he has been recuperating at home.  He has noticed over the last 2 days, he becomes quite dyspneic with any sort of exertion.  He also developed some bruising and pain in his popliteal fossa.  He called the office today.  They had him scheduled for an ultrasound at [facility].  Patient states that he felt bad and directed his wife to take him to [facility].  In the ER, patient noted to be tachycardic when he walked in with initial heart rate of 149.  Room air sats were 100.  Patient was given 2 L of IV fluids.  He was still having some ambulatory tachycardia.  Lower extremity ultrasound was negative for DVT.  CTPA was negative for PE.  Due to his continued tachycardia, triad hospitalist consulted for admission.  Patient denies any chest pain.  No nausea no vomiting.  No diarrhea.  He does admit to exertional dyspnea.  Patient states that he has not been drinking a lot of liquids over the last several days.  Patient had a urinal at his bedside.  The urine was dark in color even after 2 L of IV fluids.  See picture.     ED Course: pt given 2 L IVF. LE U/S negative for DVT, CTPA negative for PE.  Review of Systems:  Review of Systems  Constitutional: Negative.   HENT: Negative.    Eyes: Negative.   Respiratory:         DOE  Cardiovascular: Negative.  Negative for chest pain, palpitations and orthopnea.  Gastrointestinal: Negative.    Genitourinary: Negative.   Musculoskeletal:        Pain behind right knee. Bruising of right knee  Skin: Negative.   Neurological: Negative.   Endo/Heme/Allergies:  Bruises/bleeds easily.       Bruising behind right knee  Psychiatric/Behavioral: Negative.    All other systems reviewed and are negative.  Past Medical History:  Diagnosis Date   Arthritis    Diabetes mellitus without complication (HCC)    GERD (gastroesophageal reflux disease)    Gout    History of kidney stones    Hypertension    Pneumonia    several years ago    Past Surgical History:  Procedure Laterality Date   CYSTOSCOPY/RETROGRADE/URETEROSCOPY/STONE EXTRACTION WITH BASKET Bilateral 08/29/2016   Procedure: CYSTOSCOPY/BILATERAL RETEROGRADE BILATERTAL URETEROSCOPY/STONE EXTRACTION WITH BASKET, RIGHT STENT PLACEMENT, HOLIUM LASER;  Surgeon: Bjorn Pippin, MD;  Location: [facility];  Service: Urology;  Laterality: Bilateral;   KNEE SURGERY     x 4   STONE EXTRACTION WITH BASKET     STRABISMUS SURGERY Right 04/02/2015   Procedure: REPAIR STRABISMUS RIGHT EYE;  Surgeon: Verne Carrow, MD;  Location:  SURGERY CENTER;  Service: Ophthalmology;  Laterality: Right;   TOTAL KNEE ARTHROPLASTY Right 10/29/2020   Procedure: RIGHT TOTAL KNEE ARTHROPLASTY;  Surgeon: Kathryne Hitch, MD;  Location: [facility];  Service: Orthopedics;  Laterality: Right;     reports that  he has never smoked. He has never used smokeless tobacco. He reports that he does not drink alcohol and does not use drugs.  Allergies  Allergen Reactions   Penicillins Other (See Comments)    Has patient had a PCN reaction causing immediate rash, facial/tongue/throat swelling, SOB or lightheadedness with hypotension: Unknown Has patient had a PCN reaction causing severe rash involving mucus membranes or skin necrosis: Unknown Has patient had a PCN reaction that required hospitalization: Unknown Has patient had a PCN reaction occurring within the  last 10 years: No Childhood reaction If all of the above answers are "NO", then may proceed with Cephalosporin use.     No family history on file.  Prior to Admission medications   Medication Sig Start Date End Date Taking? Authorizing Provider  acetaminophen (TYLENOL) 650 MG CR tablet Take 1,300 mg by mouth every 8 (eight) hours as needed for pain.   Yes [provider]  amLODipine (NORVASC) 5 MG tablet Take 5 mg by mouth daily.   Yes [provider]  aspirin 81 MG chewable tablet Chew 1 tablet (81 mg total) by mouth 2 (two) times daily. 10/30/20  Yes Kathryne Hitch, MD  atorvastatin (LIPITOR) 20 MG tablet Take 20 mg by mouth daily.   Yes [provider]  fluticasone (FLONASE) 50 MCG/ACT nasal spray Place 1 spray into both nostrils 2 (two) times daily as needed for allergies or rhinitis.   Yes [provider]  metFORMIN (GLUCOPHAGE) 500 MG tablet Take 1,000 mg by mouth at bedtime.   Yes [provider]  metoprolol succinate (TOPROL-XL) 25 MG 24 hr tablet Take 12.5 mg by mouth daily.   Yes [provider]  oxyCODONE (OXY IR/ROXICODONE) 5 MG immediate release tablet Take 1-2 tablets (5-10 mg total) by mouth every 4 (four) hours as needed for moderate pain (pain score 4-6). 11/10/20  Yes Kirtland Bouchard, PA-C  methocarbamol (ROBAXIN) 500 MG tablet Take 1 tablet (500 mg total) by mouth every 6 (six) hours as needed for muscle spasms. Patient not taking: No sig reported 10/30/20   Kathryne Hitch, MD    Physical Exam: Vitals:   11/12/20 1730 11/12/20 1800 11/12/20 2100 11/12/20 2130  BP: (!) 142/96 138/74 132/73 133/89  Pulse: (!) 102 94 100   Resp:  12 17 15   Temp:      TempSrc:      SpO2: 100% 98% 100%     Physical Exam   Labs on Admission: I have personally reviewed following labs and imaging studies  CBC: Recent Labs  Lab 11/12/20 1448  WBC 10.9*  HGB 13.2  HCT 39.0  MCV 86.5  PLT 682*   Basic Metabolic  Panel: Recent Labs  Lab 11/12/20 1448  NA 134*  K 4.0  CL 100  CO2 24  GLUCOSE 301*  BUN 18  CREATININE 1.03  CALCIUM 9.5   GFR: CrCl cannot be calculated (Unknown ideal weight.). Liver Function Tests: No results for input(s): AST, ALT, ALKPHOS, BILITOT, PROT, ALBUMIN in the last 168 hours. No results for input(s): LIPASE, AMYLASE in the last 168 hours. No results for input(s): AMMONIA in the last 168 hours. Coagulation Profile: No results for input(s): INR, PROTIME in the last 168 hours. Cardiac Enzymes: No results for input(s): CKTOTAL, CKMB, CKMBINDEX, TROPONINI in the last 168 hours. BNP (last 3 results) No results for input(s): PROBNP in the last 8760 hours. HbA1C: No results for input(s): HGBA1C in the last 72 hours. CBG: No results  for input(s): GLUCAP in the last 168 hours. Lipid Profile: No results for input(s): CHOL, HDL, LDLCALC, TRIG, CHOLHDL, LDLDIRECT in the last 72 hours. Thyroid Function Tests: No results for input(s): TSH, T4TOTAL, FREET4, T3FREE, THYROIDAB in the last 72 hours. Anemia Panel: No results for input(s): VITAMINB12, FOLATE, FERRITIN, TIBC, IRON, RETICCTPCT in the last 72 hours. Urine analysis: No results found for: COLORURINE, APPEARANCEUR, LABSPEC, PHURINE, GLUCOSEU, HGBUR, BILIRUBINUR, KETONESUR, PROTEINUR, UROBILINOGEN, NITRITE, LEUKOCYTESUR  Radiological Exams on Admission: I have personally reviewed images DG Chest 2 View  Result Date: 11/12/2020 CLINICAL DATA:  Chest pain EXAM: CHEST - 2 VIEW COMPARISON:  None. FINDINGS: The heart size and mediastinal contours are within normal limits. Both lungs are clear. The visualized skeletal structures are unremarkable. IMPRESSION: No evidence of acute cardiopulmonary disease. Electronically Signed   By: Caprice RenshawJacob  Kahn   On: 11/12/2020 15:09   CT Angio Chest Pulmonary Embolism (PE) W or WO Contrast  Result Date: 11/12/2020 CLINICAL DATA:  PE suspected EXAM: CT ANGIOGRAPHY CHEST WITH CONTRAST TECHNIQUE:  Multidetector CT imaging of the chest was performed using the standard protocol during bolus administration of intravenous contrast. Multiplanar CT image reconstructions and MIPs were obtained to evaluate the vascular anatomy. CONTRAST:  100mL OMNIPAQUE IOHEXOL 350 MG/ML SOLN COMPARISON:  03/23/2019 FINDINGS: Cardiovascular: Satisfactory opacification of the pulmonary arteries to the segmental level. No evidence of pulmonary embolism. Normal heart size. No pericardial effusion. Mediastinum/Nodes: No enlarged mediastinal, hilar, or axillary lymph nodes. Thyroid gland, trachea, and esophagus demonstrate no significant findings. Lungs/Pleura: Lungs are clear. No pleural effusion or pneumothorax. Upper Abdomen: No acute abnormality. Small nonobstructive left renal calculus. Musculoskeletal: No chest wall abnormality. No acute or significant osseous findings. Review of the MIP images confirms the above findings. IMPRESSION: 1. Negative examination for pulmonary embolism. 2. Small nonobstructive left renal calculus. Electronically Signed   By: Lauralyn PrimesAlex  Bibbey M.D.   On: 11/12/2020 17:54   US Venous Img Lower Unilateral Right  Result Date: 11/12/2020 CLINICAL DATA:  Right calf tenderness and status post right knee arthroplasty on 10/29/2020 EXAM: RIGHT LOWER EXTREMITY VENOUS DOPPLER ULTRASOUND TECHNIQUE: Gray-scale sonography with graded compression, as well as color Doppler and duplex ultrasound were performed to evaluate the lower extremity deep venous systems from the level of the common femoral vein and including the common femoral, femoral, profunda femoral, popliteal and calf veins including the posterior tibial, peroneal and gastrocnemius veins when visible. The superficial great saphenous vein was also interrogated. Spectral Doppler was utilized to evaluate flow at rest and with distal augmentation maneuvers in the common femoral, femoral and popliteal veins. COMPARISON:  None. FINDINGS: Contralateral Common  Femoral Vein: Respiratory phasicity is normal and symmetric with the symptomatic side. No evidence of thrombus. Normal compressibility. Common Femoral Vein: No evidence of thrombus. Normal compressibility, respiratory phasicity and response to augmentation. Saphenofemoral Junction: No evidence of thrombus. Normal compressibility and flow on color Doppler imaging. Profunda Femoral Vein: No evidence of thrombus. Normal compressibility and flow on color Doppler imaging. Femoral Vein: No evidence of thrombus. Normal compressibility, respiratory phasicity and response to augmentation. Popliteal Vein: No evidence of thrombus. Normal compressibility, respiratory phasicity and response to augmentation. Calf Veins: No evidence of thrombus. Normal compressibility and flow on color Doppler imaging. Superficial Great Saphenous Vein: No evidence of thrombus. Normal compressibility. Venous Reflux:  None. Other Findings: No evidence of superficial thrombophlebitis or abnormal fluid collection. IMPRESSION: No evidence of right lower extremity deep venous thrombosis. No focal fluid collections identified. Electronically Signed   By:  Irish Lack M.D.   On: 11/12/2020 15:57    EKG: I have personally reviewed EKG: sinus tachycardia   Assessment/Plan Principal Problem:   Sinus tachycardia Active Problems:   Dehydration    Sinus tachycardia I think pt's sinus tachycardia being driven by pt's near lack of fluid intake of the last 3 days. Pt states he has not been drinking very much liquid over the last 3 days because he was not thirsty. Pt's urine is dark yellow. Pt has received 2 L of IV with some improvement in his ambulatory HR. Will give him his evening dose of Toprol-XL and have him drink lots of water. If HR improves after increased water intake and po toprol, then I think he can be safely discharged to home. Pt's LE U/S negative for DVT and CTPA negative for PE.  Dehydration S/p 2 L IVF. Continue to encourage  lots of fluid intake.  Family Communication: discussed with pt and wife at bedside  Disposition Plan: DC to home  Consults called: none    Carollee Herter, DO [facility] 11/12/2020, 10:05 PM

## 2020-11-12 NOTE — ED Provider Notes (Signed)
[facility] EMERGENCY DEPARTMENT Provider Note   CSN: [id number] Arrival date & time: 11/12/20  1408     History Chief Complaint  Patient presents with   Leg Swelling    [name] [name] is a 50 y.o. male.  HPI 50 year old male presents with right calf pain and dyspnea. Around 2 weeks ago he had his right knee replaced. That leg has been swollen and bruised since, but now he's noticing some discomfort when palpating his right calf and a new bruise. He has also been feeling dyspneic today intermittently. Mostly feels like he can't get a full breath. No chest pain, cough, fever. His HR has been running high, especially with activity, for the past few days. Was going to go to [facility] for a DVT ultrasound but was dyspneic today and got concerned and came to this ED instead.  Past Medical History:  Diagnosis Date   Arthritis    Diabetes mellitus without complication (HCC)    GERD (gastroesophageal reflux disease)    Gout    History of kidney stones    Hypertension    Pneumonia    several years ago    Patient Active Problem List   Diagnosis Date Noted   Sinus tachycardia 11/12/2020   Dehydration 11/12/2020   Status post right knee replacement 10/29/2020   Morbid (severe) obesity due to excess calories (HCC) 12/18/2019   Unilateral primary osteoarthritis, right knee 11/08/2016    Past Surgical History:  Procedure Laterality Date   CYSTOSCOPY/RETROGRADE/URETEROSCOPY/STONE EXTRACTION WITH BASKET Bilateral 08/29/2016   Procedure: CYSTOSCOPY/BILATERAL RETEROGRADE BILATERTAL URETEROSCOPY/STONE EXTRACTION WITH BASKET, RIGHT STENT PLACEMENT, HOLIUM LASER;  Surgeon: Bjorn Pippin, MD;  Location: [facility];  Service: Urology;  Laterality: Bilateral;   KNEE SURGERY     x 4   STONE EXTRACTION WITH BASKET     STRABISMUS SURGERY Right 04/02/2015   Procedure: REPAIR STRABISMUS RIGHT EYE;  Surgeon: Verne Carrow, MD;  Location: [facility];  Service: Ophthalmology;  Laterality: Right;    TOTAL KNEE ARTHROPLASTY Right 10/29/2020   Procedure: RIGHT TOTAL KNEE ARTHROPLASTY;  Surgeon: Kathryne Hitch, MD;  Location: [facility];  Service: Orthopedics;  Laterality: Right;       No family history on file.  Social History   Tobacco Use   Smoking status: Never   Smokeless tobacco: Never  Vaping Use   Vaping Use: Never used  Substance Use Topics   Alcohol use: No   Drug use: No    Home Medications Prior to Admission medications   Medication Sig Start Date End Date Taking? Authorizing Provider  acetaminophen (TYLENOL) 650 MG CR tablet Take 1,300 mg by mouth every 8 (eight) hours as needed for pain.   Yes [provider]  amLODipine (NORVASC) 5 MG tablet Take 5 mg by mouth daily.   Yes [provider]  aspirin 81 MG chewable tablet Chew 1 tablet (81 mg total) by mouth 2 (two) times daily. 10/30/20  Yes Kathryne Hitch, MD  atorvastatin (LIPITOR) 20 MG tablet Take 20 mg by mouth daily.   Yes [provider]  fluticasone (FLONASE) 50 MCG/ACT nasal spray Place 1 spray into both nostrils 2 (two) times daily as needed for allergies or rhinitis.   Yes [provider]  metFORMIN (GLUCOPHAGE) 500 MG tablet Take 1,000 mg by mouth at bedtime.   Yes [provider]  metoprolol succinate (TOPROL-XL) 25 MG 24 hr tablet Take 12.5 mg by mouth daily.   Yes [provider]  oxyCODONE (OXY IR/ROXICODONE) 5 MG immediate release tablet Take 1-2 tablets (5-10 mg total) by mouth every 4 (four) hours as needed for moderate pain (pain score 4-6). 11/10/20  Yes Kirtland Bouchardlark, Gilbert W, PA-C  methocarbamol (ROBAXIN) 500 MG tablet Take 1 tablet (500 mg total) by mouth every 6 (six) hours as needed for muscle spasms. Patient not taking: No sig reported 10/30/20   Kathryne HitchBlackman, Christopher Y, MD    Allergies    Penicillins  Review of Systems   Review of Systems  Constitutional:  Negative for fever.  Respiratory:  Positive for shortness of breath.  Negative for cough.   Cardiovascular:  Positive for leg swelling. Negative for chest pain.  Gastrointestinal:  Negative for diarrhea and vomiting.  All other systems reviewed and are negative.  Physical Exam Updated Vital Signs BP 138/75   Pulse 93   Temp 98.4 F (36.9 C) (Oral)   Resp 19   SpO2 99%   Physical Exam Vitals and nursing note reviewed.  Constitutional:      General: He is not in acute distress.    Appearance: He is well-developed. He is obese. He is not ill-appearing or diaphoretic.  HENT:     Head: Normocephalic and atraumatic.     Right Ear: External ear normal.     Left Ear: External ear normal.     Nose: Nose normal.  Eyes:     General:        Right eye: No discharge.        Left eye: No discharge.  Cardiovascular:     Rate and Rhythm: Regular rhythm. Tachycardia present.     Pulses:          Dorsalis pedis pulses are 2+ on the right side.     Heart sounds: Normal heart sounds.  Pulmonary:     Effort: Pulmonary effort is normal.     Breath sounds: Normal breath sounds. No wheezing, rhonchi or rales.  Abdominal:     Palpations: Abdomen is soft.     Tenderness: There is no abdominal tenderness.  Musculoskeletal:     Cervical back: Neck supple.     Comments: Right knee wound appears C/D/I Diffuse ecchymosis to right lower leg. On the calf there is some more ecchymosis with mild tenderness Right leg is diffusely swollen.   Skin:    General: Skin is warm and dry.  Neurological:     Mental Status: He is alert.  Psychiatric:        Mood and Affect: Mood is not anxious.    ED Results / Procedures / Treatments   Labs (all labs ordered are listed, but only abnormal results are displayed) Labs Reviewed  BASIC METABOLIC PANEL - Abnormal; Notable for the following components:      Result Value   Sodium 134 (*)    Glucose, Bld 301 (*)    All other components within normal limits  CBC - Abnormal; Notable for the following components:   WBC 10.9 (*)     Platelets 682 (*)    All other components within normal limits  RESP PANEL BY RT-PCR (FLU A&B, COVID) ARPGX2  TROPONIN I (HIGH SENSITIVITY)  TROPONIN I (HIGH SENSITIVITY)    EKG None  Radiology DG Chest 2 View  Result Date: 11/12/2020 CLINICAL DATA:  Chest pain EXAM: CHEST - 2 VIEW COMPARISON:  None. FINDINGS: The heart size and mediastinal contours are within normal limits. Both lungs are clear. The visualized skeletal structures are unremarkable. IMPRESSION: No  evidence of acute cardiopulmonary disease. Electronically Signed   By: Caprice Renshaw   On: 11/12/2020 15:09   CT Angio Chest Pulmonary Embolism (PE) W or WO Contrast  Result Date: 11/12/2020 CLINICAL DATA:  PE suspected EXAM: CT ANGIOGRAPHY CHEST WITH CONTRAST TECHNIQUE: Multidetector CT imaging of the chest was performed using the standard protocol during bolus administration of intravenous contrast. Multiplanar CT image reconstructions and MIPs were obtained to evaluate the vascular anatomy. CONTRAST:  OMNIPAQUE IOHEXOL 350 MG/ML SOLN COMPARISON:  03/23/2019 FINDINGS: Cardiovascular: Satisfactory opacification of the pulmonary arteries to the segmental level. No evidence of pulmonary embolism. Normal heart size. No pericardial effusion. Mediastinum/Nodes: No enlarged mediastinal, hilar, or axillary lymph nodes. Thyroid gland, trachea, and esophagus demonstrate no significant findings. Lungs/Pleura: Lungs are clear. No pleural effusion or pneumothorax. Upper Abdomen: No acute abnormality. Small nonobstructive left renal calculus. Musculoskeletal: No chest wall abnormality. No acute or significant osseous findings. Review of the MIP images confirms the above findings. IMPRESSION: 1. Negative examination for pulmonary embolism. 2. Small nonobstructive left renal calculus. Electronically Signed   By: Lauralyn Primes M.D.   On: 11/12/2020 17:54   US Venous Img Lower Unilateral Right  Result Date: 11/12/2020 CLINICAL DATA:  Right calf  tenderness and status post right knee arthroplasty on 10/29/2020 EXAM: RIGHT LOWER EXTREMITY VENOUS DOPPLER ULTRASOUND TECHNIQUE: Gray-scale sonography with graded compression, as well as color Doppler and duplex ultrasound were performed to evaluate the lower extremity deep venous systems from the level of the common femoral vein and including the common femoral, femoral, profunda femoral, popliteal and calf veins including the posterior tibial, peroneal and gastrocnemius veins when visible. The superficial great saphenous vein was also interrogated. Spectral Doppler was utilized to evaluate flow at rest and with distal augmentation maneuvers in the common femoral, femoral and popliteal veins. COMPARISON:  None. FINDINGS: Contralateral Common Femoral Vein: Respiratory phasicity is normal and symmetric with the symptomatic side. No evidence of thrombus. Normal compressibility. Common Femoral Vein: No evidence of thrombus. Normal compressibility, respiratory phasicity and response to augmentation. Saphenofemoral Junction: No evidence of thrombus. Normal compressibility and flow on color Doppler imaging. Profunda Femoral Vein: No evidence of thrombus. Normal compressibility and flow on color Doppler imaging. Femoral Vein: No evidence of thrombus. Normal compressibility, respiratory phasicity and response to augmentation. Popliteal Vein: No evidence of thrombus. Normal compressibility, respiratory phasicity and response to augmentation. Calf Veins: No evidence of thrombus. Normal compressibility and flow on color Doppler imaging. Superficial Great Saphenous Vein: No evidence of thrombus. Normal compressibility. Venous Reflux:  None. Other Findings: No evidence of superficial thrombophlebitis or abnormal fluid collection. IMPRESSION: No evidence of right lower extremity deep venous thrombosis. No focal fluid collections identified. Electronically Signed   By: Irish Lack M.D.   On: 11/12/2020 15:57     Procedures Procedures   Medications Ordered in ED Medications  sodium chloride 0.9 % bolus 1,000 mL (0 mLs Intravenous Stopped 11/12/20 1858)  iohexol (OMNIPAQUE) 350 MG/ML injection 100 mL (100 mLs Intravenous Contrast Given 11/12/20 1715)  lactated ringers bolus 1,000 mL (0 mLs Intravenous Stopped 11/12/20 2145)  metoprolol succinate (TOPROL-XL) 24 hr tablet 12.5 mg (12.5 mg Oral Given 11/12/20 2146)    ED Course  I have reviewed the triage vital signs and the nursing notes.  Pertinent labs & imaging results that were available during my care of the patient were reviewed by me and considered in my medical decision making (see chart for details).    MDM Rules/Calculators/A&P  Given his presentation with significant tachycardia, workup for DVT/PE pursued. This is negative. He has hyperglycemia but no other significant findings on workup. However he continues to get tachycardic into 130s when he stands. This is likely the cause of his dyspnea when walking. Troponins negative. Given IVF. D/w hospitalist, Dr. Imogene Burn, who has evaluated patient. He recommends large amounts of water and metoprolol PO here.  Patient still gets tachycardic when standing but is improved.  Hospitalist feels like patient is stable for discharge and patient is feeling improved so I think is reasonable to discharge with strict return precautions. Increase fluids at home since he's not been drinking much over past few days.  Final Clinical Impression(s) / ED Diagnoses Final diagnoses:  Sinus tachycardia  Dehydration    Rx / DC Orders ED Discharge Orders     None        Pricilla Loveless, MD 11/12/20 2353

## 2020-11-12 NOTE — ED Provider Notes (Signed)
Emergency Medicine Provider Triage Evaluation Note  [name] [name] , a 50 y.o. male  was evaluated in triage.  Pt complains of pleuritic cp, shortness of breath, and right-sided bruising and tenderness.  Replacement knee surgery 2 weeks ago.  Review of Systems  Positive: Dyspnea, pleuritic chest pain, right lower leg tenderness Negative: Dizziness, nausea, vomiting  Physical Exam  BP (!) 140/91 (BP Location: Right Arm)   Pulse (!) 149   Temp 98.4 F (36.9 C) (Oral)   Resp 20   SpO2 97%  Gen:   Awake, diaphoretic Resp:  Normal effort, lungs CTA Cardio:  Tachycardic with regular rhythm.  MSK:   Moves extremities without difficulty  Other:  Small area of tenderness to the right with calf.   Medical Decision Making  Medically screening exam initiated at 3:00pm.  Appropriate orders placed.  [name] [name] was informed that the remainder of the evaluation will be completed by another provider, this initial triage assessment does not replace that evaluation, and the importance of remaining in the ED until their evaluation is complete.  Patient taken back to the room to be evaluated for PE, right lower extremity DVT.      [name] [name] 11/12/20 1524    [name] Hong, MD 11/13/20 [phone]

## 2020-11-12 NOTE — Telephone Encounter (Signed)
Pt states he woke up this morning to a large bruise on the back on his leg that is sore to the touch. He was here for an appt with Bronson Curb on 11/11/20 and did not feel any pain after, only this morning. Pt would like a call back to know if he should get a sooner follow up appt/ go to an urgent care or if this is normal post surg. The best call back number is [phone].

## 2020-11-12 NOTE — Assessment & Plan Note (Signed)
S/p 2 L IVF. Continue to encourage lots of fluid intake.

## 2020-11-12 NOTE — Telephone Encounter (Signed)
Patient's wife called triage line extremely upset that she had called and no one bothered getting back with her. She is concerned that patient has blood clot, as he now has a knot behind surgical knee, and does not feel that anyone in our office cares about that. I explained that [name] [name] is in clinic seeing patients and that a message has been sent to him to advise. She states that if he has a blood clot, it can go to his lungs and kill him and that she needs to know what to do right this moment. I advised that we will order ultrasound to rule out DVT. She wonders if that will be done in the next five minutes and what she should do in the meantime. I explained that I would enter the order as STAT and we would try and get it done as soon as possible. She stated if I did not know what needs to be done, to find someone else. I explained that this is the protocol to check for blood clot and the other option that she has is to take the patient to the ED. She called me many choice names and said to find out when the ultrasound would be. I called Martie Lee to see if she could work on scheduling while I had patient's wife on the triage line. When I went back to pick up wife's call, she had hung up.   Martie Lee has patient scheduled for doppler this afternoon at 3pm. She is going to try and reach wife back to advise.

## 2020-11-12 NOTE — Discharge Instructions (Addendum)
If you develop new or worsening shortness of breath, chest pain, dizziness, or any other new/concerning symptoms then return to the ER for evaluation

## 2020-11-14 ENCOUNTER — Emergency Department (HOSPITAL_COMMUNITY): Payer: BC Managed Care – PPO

## 2020-11-14 ENCOUNTER — Encounter (HOSPITAL_COMMUNITY): Payer: Self-pay

## 2020-11-14 ENCOUNTER — Other Ambulatory Visit: Payer: Self-pay

## 2020-11-14 ENCOUNTER — Emergency Department (HOSPITAL_COMMUNITY)
Admission: EM | Admit: 2020-11-14 | Discharge: 2020-11-14 | Disposition: A | Discharge status: Home / Self Care | Payer: BC Managed Care – PPO | Attending: Emergency Medicine | Admitting: Emergency Medicine

## 2020-11-14 DIAGNOSIS — R791 Abnormal coagulation profile: Secondary | ICD-10-CM | POA: Diagnosis not present

## 2020-11-14 DIAGNOSIS — Z7984 Long term (current) use of oral hypoglycemic drugs: Secondary | ICD-10-CM | POA: Insufficient documentation

## 2020-11-14 DIAGNOSIS — E119 Type 2 diabetes mellitus without complications: Secondary | ICD-10-CM | POA: Diagnosis not present

## 2020-11-14 DIAGNOSIS — Z96651 Presence of right artificial knee joint: Secondary | ICD-10-CM | POA: Insufficient documentation

## 2020-11-14 DIAGNOSIS — R600 Localized edema: Secondary | ICD-10-CM | POA: Diagnosis not present

## 2020-11-14 DIAGNOSIS — Z20822 Contact with and (suspected) exposure to covid-19: Secondary | ICD-10-CM | POA: Diagnosis not present

## 2020-11-14 DIAGNOSIS — Z79899 Other long term (current) drug therapy: Secondary | ICD-10-CM | POA: Diagnosis not present

## 2020-11-14 DIAGNOSIS — Z7982 Long term (current) use of aspirin: Secondary | ICD-10-CM | POA: Insufficient documentation

## 2020-11-14 DIAGNOSIS — I10 Essential (primary) hypertension: Secondary | ICD-10-CM | POA: Insufficient documentation

## 2020-11-14 DIAGNOSIS — R000 Tachycardia, unspecified: Secondary | ICD-10-CM

## 2020-11-14 LAB — CBC WITH DIFFERENTIAL/PLATELET
Abs Immature Granulocytes: 0.02 10*3/uL (ref 0.00–0.07)
Basophils Absolute: 0 10*3/uL (ref 0.0–0.1)
Basophils Relative: 0 %
Eosinophils Absolute: 0.1 10*3/uL (ref 0.0–0.5)
Eosinophils Relative: 1 %
HCT: 34.3 % — ABNORMAL LOW (ref 39.0–52.0)
Hemoglobin: 11.8 g/dL — ABNORMAL LOW (ref 13.0–17.0)
Immature Granulocytes: 0 %
Lymphocytes Relative: 14 %
Lymphs Abs: 1 10*3/uL (ref 0.7–4.0)
MCH: 30 pg (ref 26.0–34.0)
MCHC: 34.4 g/dL (ref 30.0–36.0)
MCV: 87.3 fL (ref 80.0–100.0)
Monocytes Absolute: 0.5 10*3/uL (ref 0.1–1.0)
Monocytes Relative: 8 %
Neutro Abs: 5.5 10*3/uL (ref 1.7–7.7)
Neutrophils Relative %: 77 %
Platelets: 475 10*3/uL — ABNORMAL HIGH (ref 150–400)
RBC: 3.93 MIL/uL — ABNORMAL LOW (ref 4.22–5.81)
RDW: 12.1 % (ref 11.5–15.5)
WBC: 7.2 10*3/uL (ref 4.0–10.5)
nRBC: 0 % (ref 0.0–0.2)

## 2020-11-14 LAB — URINALYSIS, ROUTINE W REFLEX MICROSCOPIC
Bilirubin Urine: NEGATIVE
Glucose, UA: 150 mg/dL — AB
Ketones, ur: NEGATIVE mg/dL
Leukocytes,Ua: NEGATIVE
Nitrite: NEGATIVE
Protein, ur: NEGATIVE mg/dL
Specific Gravity, Urine: 1.014 (ref 1.005–1.030)
pH: 6 (ref 5.0–8.0)

## 2020-11-14 LAB — PROTIME-INR
INR: 0.9 (ref 0.8–1.2)
Prothrombin Time: 12.6 seconds (ref 11.4–15.2)

## 2020-11-14 LAB — COMPREHENSIVE METABOLIC PANEL
ALT: 18 U/L (ref 0–44)
AST: 16 U/L (ref 15–41)
Albumin: 3.6 g/dL (ref 3.5–5.0)
Alkaline Phosphatase: 129 U/L — ABNORMAL HIGH (ref 38–126)
Anion gap: 7 (ref 5–15)
BUN: 14 mg/dL (ref 6–20)
CO2: 25 mmol/L (ref 22–32)
Calcium: 8.6 mg/dL — ABNORMAL LOW (ref 8.9–10.3)
Chloride: 99 mmol/L (ref 98–111)
Creatinine, Ser: 0.98 mg/dL (ref 0.61–1.24)
GFR, Estimated: >60 mL/min (ref >60)
Glucose, Bld: 287 mg/dL — ABNORMAL HIGH (ref 70–99)
Potassium: 3.6 mmol/L (ref 3.5–5.1)
Sodium: 131 mmol/L — ABNORMAL LOW (ref 135–145)
Total Bilirubin: 0.9 mg/dL (ref 0.3–1.2)
Total Protein: 6.3 g/dL — ABNORMAL LOW (ref 6.5–8.1)

## 2020-11-14 LAB — APTT: aPTT: 31 seconds (ref 24–36)

## 2020-11-14 LAB — LACTIC ACID, PLASMA
Lactic Acid, Venous: 2.5 mmol/L (ref 0.5–1.9)
Lactic Acid, Venous: 1.6 mmol/L (ref 0.5–1.9)

## 2020-11-14 LAB — RESP PANEL BY RT-PCR (FLU A&B, COVID) ARPGX2
Influenza A by PCR: NEGATIVE
Influenza B by PCR: NEGATIVE
SARS Coronavirus 2 by RT PCR: NEGATIVE

## 2020-11-14 MED ORDER — SODIUM CHLORIDE 0.9 % IV BOLUS (SEPSIS)
1000.0000 mL | Freq: Once | INTRAVENOUS | Status: AC
  Administered 2020-11-14: 1000 mL via INTRAVENOUS

## 2020-11-14 MED ORDER — LEVOFLOXACIN 750 MG PO TABS: 750.0000 mg | ORAL_TABLET | Freq: Every day | ORAL | 0 refills | Status: DC

## 2020-11-14 MED ORDER — LEVOFLOXACIN 750 MG PO TABS
750.0000 mg | ORAL_TABLET | Freq: Once | ORAL | Status: AC
  Administered 2020-11-14: 750 mg via ORAL
  Filled 2020-11-14: qty 1

## 2020-11-14 MED ORDER — LACTATED RINGERS IV SOLN: INTRAVENOUS | Status: DC

## 2020-11-14 NOTE — ED Notes (Signed)
Pt here via ems with reports of right total knee replacement approx 2 weeks ago, seen here on Friday for pain to right calf, pt discharged home, pt returns today with concerns for elevated heart rate. Pt hr in the 120s-130s currently on monitor. Dr. Hyacinth Meeker in room for exam during triage, sepsis alert initiated. Pt had staples removed from right knee on Thursday, leg appears to be healing well. CMS intact

## 2020-11-14 NOTE — ED Provider Notes (Signed)
[facility] EMERGENCY DEPARTMENT Provider Note   CSN: [id number] Arrival date & time: 11/14/20  1232     History Chief Complaint  Patient presents with   Tachycardia    S/P right knee 7/22-repeat visit for elevated heart rate, blood sugar in route over 300    [name] [name] is a 50 y.o. male.  HPI   This pt is a 50 y/o male - with hx of DM and HTN, had a TKA in July - and has done well with no significant pain of the R knee since - he was seen a couple of days ago with c/o tachycardia - had w/u that showed no fever, no PE, no DVT -during that visit the patient had endorsed drinking very little fluids, his tachycardia improved with IV fluids after the hospitalist was consulted for admission and recommended hydration and discharge instead.  He had done well until today, he noted while he was getting around his house that he developed a feeling of palpitations, he took his pulse which got as high as 145, he states it has been as low as 65, he has been relatively asymptomatic and is not aware that he had a fever of 100.6 on arrival.  He denies coughing or shortness of breath, he has no chest pain abdominal pain or back pain, there is no neck pain headache sore throat runny nose or increasing rashes on his skin.  He has no diarrhea dysuria or frequency.  He has no history of frequent infections, he has been taking his diabetic medications as prescribed.  He is also on low-dose metoprolol and amlodipine for his blood pressure which she has been compliant with.  He has followed up with his orthopedic surgeon, he is doing his range of motion exercises and can currently flex the knee to 90 degrees, there is no pain with doing this.  Past Medical History:  Diagnosis Date   Arthritis    Diabetes mellitus without complication (HCC)    GERD (gastroesophageal reflux disease)    Gout    History of kidney stones    Hypertension    Pneumonia    several years ago    Patient Active Problem List    Diagnosis Date Noted   Sinus tachycardia 11/12/2020   Dehydration 11/12/2020   Status post right knee replacement 10/29/2020   Morbid (severe) obesity due to excess calories (HCC) 12/18/2019   Unilateral primary osteoarthritis, right knee 11/08/2016    Past Surgical History:  Procedure Laterality Date   CYSTOSCOPY/RETROGRADE/URETEROSCOPY/STONE EXTRACTION WITH BASKET Bilateral 08/29/2016   Procedure: CYSTOSCOPY/BILATERAL RETEROGRADE BILATERTAL URETEROSCOPY/STONE EXTRACTION WITH BASKET, RIGHT STENT PLACEMENT, HOLIUM LASER;  Surgeon: Bjorn Pippin, MD;  Location: [facility];  Service: Urology;  Laterality: Bilateral;   KNEE SURGERY     x 4   STONE EXTRACTION WITH BASKET     STRABISMUS SURGERY Right 04/02/2015   Procedure: REPAIR STRABISMUS RIGHT EYE;  Surgeon: Verne Carrow, MD;  Location: [facility];  Service: Ophthalmology;  Laterality: Right;   TOTAL KNEE ARTHROPLASTY Right 10/29/2020   Procedure: RIGHT TOTAL KNEE ARTHROPLASTY;  Surgeon: Kathryne Hitch, MD;  Location: [facility];  Service: Orthopedics;  Laterality: Right;       No family history on file.  Social History   Tobacco Use   Smoking status: Never   Smokeless tobacco: Never  Vaping Use   Vaping Use: Never used  Substance Use Topics   Alcohol use: No   Drug use: No  Home Medications Prior to Admission medications   Medication Sig Start Date End Date Taking? Authorizing Provider  levofloxacin (LEVAQUIN) 750 MG tablet Take 1 tablet (750 mg total) by mouth daily. 11/14/20  Yes Eber Hong, MD  acetaminophen (TYLENOL) 650 MG CR tablet Take 1,300 mg by mouth every 8 (eight) hours as needed for pain.    [provider]  amLODipine (NORVASC) 5 MG tablet Take 5 mg by mouth daily.    [provider]  aspirin 81 MG chewable tablet Chew 1 tablet (81 mg total) by mouth 2 (two) times daily. 10/30/20   Kathryne Hitch, MD  atorvastatin (LIPITOR) 20 MG tablet Take 20 mg by mouth daily.     [provider]  fluticasone (FLONASE) 50 MCG/ACT nasal spray Place 1 spray into both nostrils 2 (two) times daily as needed for allergies or rhinitis.    [provider]  metFORMIN (GLUCOPHAGE) 500 MG tablet Take 1,000 mg by mouth at bedtime.    [provider]  methocarbamol (ROBAXIN) 500 MG tablet Take 1 tablet (500 mg total) by mouth every 6 (six) hours as needed for muscle spasms. Patient not taking: No sig reported 10/30/20   Kathryne Hitch, MD  metoprolol succinate (TOPROL-XL) 25 MG 24 hr tablet Take 12.5 mg by mouth daily.    [provider]  oxyCODONE (OXY IR/ROXICODONE) 5 MG immediate release tablet Take 1-2 tablets (5-10 mg total) by mouth every 4 (four) hours as needed for moderate pain (pain score 4-6). 11/10/20   Kirtland Bouchard, PA-C    Allergies    Penicillins  Review of Systems   Review of Systems  All other systems reviewed and are negative.  Physical Exam Updated Vital Signs BP (!) 146/73   Pulse 95   Temp 98.9 F (37.2 C)   Resp 14   Ht 1.905 m (6\' 3" )   Wt (!) 145.2 kg   SpO2 98%   BMI 40.00 kg/m   Physical Exam Vitals and nursing note reviewed.  Constitutional:      General: He is not in acute distress.    Appearance: He is well-developed.  HENT:     Head: Normocephalic and atraumatic.     Mouth/Throat:     Pharynx: No oropharyngeal exudate.  Eyes:     General: No scleral icterus.       Right eye: No discharge.        Left eye: No discharge.     Conjunctiva/sclera: Conjunctivae normal.     Pupils: Pupils are equal, round, and reactive to light.  Neck:     Thyroid: No thyromegaly.     Vascular: No JVD.  Cardiovascular:     Rate and Rhythm: Regular rhythm. Tachycardia present.     Heart sounds: Normal heart sounds. No murmur heard.   No friction rub. No gallop.     Comments: Heart rate of 110 bpm on clinical exam, no murmurs Pulmonary:     Effort: Pulmonary effort is normal. No respiratory distress.      Breath sounds: Normal breath sounds. No wheezing or rales.  Abdominal:     General: Bowel sounds are normal. There is no distension.     Palpations: Abdomen is soft. There is no mass.     Tenderness: There is no abdominal tenderness.  Musculoskeletal:        General: No tenderness. Normal range of motion.     Cervical back: Normal range of motion and neck supple.  Right lower leg: Edema present.  Lymphadenopathy:     Cervical: No cervical adenopathy.  Skin:    General: Skin is warm and dry.     Findings: No erythema or rash.  Neurological:     General: No focal deficit present.     Mental Status: He is alert.     Coordination: Coordination normal.     Comments: The patient is awake alert and following commands without difficulty, no focal deficits  Psychiatric:        Behavior: Behavior normal.    ED Results / Procedures / Treatments   Labs (all labs ordered are listed, but only abnormal results are displayed) Labs Reviewed  LACTIC ACID, PLASMA - Abnormal; Notable for the following components:      Result Value   Lactic Acid, Venous 2.5 (*)    All other components within normal limits  COMPREHENSIVE METABOLIC PANEL - Abnormal; Notable for the following components:   Sodium 131 (*)    Glucose, Bld 287 (*)    Calcium 8.6 (*)    Total Protein 6.3 (*)    Alkaline Phosphatase 129 (*)    All other components within normal limits  CBC WITH DIFFERENTIAL/PLATELET - Abnormal; Notable for the following components:   RBC 3.93 (*)    Hemoglobin 11.8 (*)    HCT 34.3 (*)    Platelets 475 (*)    All other components within normal limits  URINALYSIS, ROUTINE W REFLEX MICROSCOPIC - Abnormal; Notable for the following components:   Glucose, UA 150 (*)    Hgb urine dipstick SMALL (*)    Bacteria, UA RARE (*)    All other components within normal limits  RESP PANEL BY RT-PCR (FLU A&B, COVID) ARPGX2  CULTURE, BLOOD (ROUTINE X 2)  CULTURE, BLOOD (ROUTINE X 2)  URINE CULTURE   PROTIME-INR  APTT  LACTIC ACID, PLASMA    EKG EKG Interpretation  Date/Time:  Sunday November 14 2020 12:47:21 EDT Ventricular Rate:  116 PR Interval:  164 QRS Duration: 98 QT Interval:  320 QTC Calculation: 445 R Axis:   149 Text Interpretation: Sinus tachycardia Low voltage, precordial leads Consider right ventricular hypertrophy Since last tracing rate faster Confirmed by Eber HongMiller, Benjermin Korber ([id number]) on 11/14/2020 1:30:38 PM  Radiology CT Angio Chest Pulmonary Embolism (PE) W or WO Contrast  Result Date: 11/12/2020 CLINICAL DATA:  PE suspected EXAM: CT ANGIOGRAPHY CHEST WITH CONTRAST TECHNIQUE: Multidetector CT imaging of the chest was performed using the standard protocol during bolus administration of intravenous contrast. Multiplanar CT image reconstructions and MIPs were obtained to evaluate the vascular anatomy. CONTRAST:  100mL OMNIPAQUE IOHEXOL 350 MG/ML SOLN COMPARISON:  03/23/2019 FINDINGS: Cardiovascular: Satisfactory opacification of the pulmonary arteries to the segmental level. No evidence of pulmonary embolism. Normal heart size. No pericardial effusion. Mediastinum/Nodes: No enlarged mediastinal, hilar, or axillary lymph nodes. Thyroid gland, trachea, and esophagus demonstrate no significant findings. Lungs/Pleura: Lungs are clear. No pleural effusion or pneumothorax. Upper Abdomen: No acute abnormality. Small nonobstructive left renal calculus. Musculoskeletal: No chest wall abnormality. No acute or significant osseous findings. Review of the MIP images confirms the above findings. IMPRESSION: 1. Negative examination for pulmonary embolism. 2. Small nonobstructive left renal calculus. Electronically Signed   By: Lauralyn PrimesAlex  Bibbey M.D.   On: 11/12/2020 17:54   US Venous Img Lower Unilateral Right  Result Date: 11/12/2020 CLINICAL DATA:  Right calf tenderness and status post right knee arthroplasty on 10/29/2020 EXAM: RIGHT LOWER EXTREMITY VENOUS DOPPLER ULTRASOUND TECHNIQUE: Gray-scale  sonography  with graded compression, as well as color Doppler and duplex ultrasound were performed to evaluate the lower extremity deep venous systems from the level of the common femoral vein and including the common femoral, femoral, profunda femoral, popliteal and calf veins including the posterior tibial, peroneal and gastrocnemius veins when visible. The superficial great saphenous vein was also interrogated. Spectral Doppler was utilized to evaluate flow at rest and with distal augmentation maneuvers in the common femoral, femoral and popliteal veins. COMPARISON:  None. FINDINGS: Contralateral Common Femoral Vein: Respiratory phasicity is normal and symmetric with the symptomatic side. No evidence of thrombus. Normal compressibility. Common Femoral Vein: No evidence of thrombus. Normal compressibility, respiratory phasicity and response to augmentation. Saphenofemoral Junction: No evidence of thrombus. Normal compressibility and flow on color Doppler imaging. Profunda Femoral Vein: No evidence of thrombus. Normal compressibility and flow on color Doppler imaging. Femoral Vein: No evidence of thrombus. Normal compressibility, respiratory phasicity and response to augmentation. Popliteal Vein: No evidence of thrombus. Normal compressibility, respiratory phasicity and response to augmentation. Calf Veins: No evidence of thrombus. Normal compressibility and flow on color Doppler imaging. Superficial Great Saphenous Vein: No evidence of thrombus. Normal compressibility. Venous Reflux:  None. Other Findings: No evidence of superficial thrombophlebitis or abnormal fluid collection. IMPRESSION: No evidence of right lower extremity deep venous thrombosis. No focal fluid collections identified. Electronically Signed   By: Irish Lack M.D.   On: 11/12/2020 15:57   DG Chest Port 1 View  Result Date: 11/14/2020 CLINICAL DATA:  Questionable sepsis, tachycardia, fever, recent knee surgery EXAM: PORTABLE CHEST 1 VIEW  COMPARISON:  11/12/2020 FINDINGS: The heart size and mediastinal contours are within normal limits. Both lungs are clear. The visualized skeletal structures are unremarkable. IMPRESSION: No acute abnormality of the lungs in AP portable projection. Electronically Signed   By: Lauralyn Primes M.D.   On: 11/14/2020 13:29    Procedures Procedures   Medications Ordered in ED Medications  lactated ringers infusion ( Intravenous New Bag/Given 11/14/20 1422)  sodium chloride 0.9 % bolus 1,000 mL (0 mLs Intravenous Stopped 11/14/20 1428)  levofloxacin (LEVAQUIN) tablet 750 mg (750 mg Oral Given 11/14/20 1421)    ED Course  I have reviewed the triage vital signs and the nursing notes.  Pertinent labs & imaging results that were available during my care of the patient were reviewed by me and considered in my medical decision making (see chart for details).  Clinical Course as of 11/14/20 1515  Sun Nov 14, 2020  1456 I attempted to call the patient's primary cardiologist, he is not taking call today, he works for [employer] cardiology in [place]. [BM]  [phone] Laboratory work-up is reassuring, he has no significant renal dysfunction no urinary tract infection no pneumonia, no leukocytosis, his heart rate is down to 95, his blood pressure is 140/70.  He reports to me that he has had some difficulty with tachycardia and mild bradycardia in the past currently on a low-dose metoprolol [BM]    Clinical Course User Index [BM] Eber Hong, MD   MDM Rules/Calculators/A&P                           The patient may very well have a fever, initially measured at 38.1 C, he is normotensive at 118/80 and a heart rate of 110.  There may be some occult infection, will obtain sepsis work-up given fever and tachycardia.  The patient is not necessarily ill-appearing at this time but  likely in need of a work-up as he did not have a fever last time and now is tachycardic with a fever.  Given the clinical exam I doubt that this is a  postoperative joint infection  Discussed case with Dr. Jacques Navy -as I was unable to get a hold of the patient's primary cardiologist in [place].  She recommends that this patient can be discharged home, and will need to have a further conversation with his cardiologist at another time but at this time nothing that would require the patient be admitted to the hospital for any further cardiac testing.  The patient currently has a heart rate of 95, he is afebrile, he has a blood pressure of 146/73 and we do not need to go up on his beta-blocker at this time as it can induce bradycardia.  The patient is agreeable to the plan  Will send home on levofloxacin, cultures pending  Final Clinical Impression(s) / ED Diagnoses Final diagnoses:  Tachycardia    Rx / DC Orders ED Discharge Orders          Ordered    levofloxacin (LEVAQUIN) 750 MG tablet  Daily        11/14/20 1513             Eber Hong, MD 11/14/20 1515

## 2020-11-14 NOTE — ED Notes (Signed)
Dr. Hyacinth Meeker notified of critical lactic 2.5

## 2020-11-14 NOTE — Sepsis Progress Note (Signed)
Secure chat with the provider regarding antibiotic order.

## 2020-11-14 NOTE — Sepsis Progress Note (Signed)
Sepsis protocol is being followed by [facility]. 

## 2020-11-14 NOTE — ED Notes (Signed)
Wife at bedside, pt denies needs

## 2020-11-14 NOTE — Discharge Instructions (Addendum)
I have spoken with my cardiologist on call today who recommends that you can follow-up with your cardiologist, no need to change medications immediately however she does recommend that you likely need to have something done in the future about reducing the frequency of the fast heart rate and the slow heart rate and the alternating symptoms that you are having.  In the meantime I would like for you to return to the emergency department for severe worsening symptoms.  I would like for you to take the medicine called levofloxacin once a day for the next 7 days, this will treat any potential underlying infections.  Make sure you are drinking plenty of clear liquids.  Thank you for letting us take care of you today!  Please obtain all of your results from medical records or have your doctors office obtain the results - share them with your doctor - you should be seen at your doctors office in the next 2 days. Call today to arrange your follow up. Take the medications as prescribed. Please review all of the medicines and only take them if you do not have an allergy to them. Please be aware that if you are taking birth control pills, taking other prescriptions, ESPECIALLY ANTIBIOTICS may make the birth control ineffective - if this is the case, either do not engage in sexual activity or use alternative methods of birth control such as condoms until you have finished the medicine and your family doctor says it is OK to restart them. If you are on a blood thinner such as COUMADIN, be aware that any other medicine that you take may cause the coumadin to either work too much, or not enough - you should have your coumadin level rechecked in next 7 days if this is the case.  ?  It is also a possibility that you have an allergic reaction to any of the medicines that you have been prescribed - Everybody reacts differently to medications and while MOST people have no trouble with most medicines, you may have a reaction such  as nausea, vomiting, rash, swelling, shortness of breath. If this is the case, please stop taking the medicine immediately and contact your physician.   If you were given a medication in the ED such as percocet, vicodin, or morphine, be aware that these medicines are sedating and may change your ability to take care of yourself adequately for several hours after being given this medicines - you should not drive or take care of small children if you were given this medicine in the Emergency Department or if you have been prescribed these types of medicines. ?   You should return to the ER IMMEDIATELY if you develop severe or worsening symptoms.

## 2020-11-16 LAB — URINE CULTURE: Culture: 2000 — AB

## 2020-11-17 ENCOUNTER — Telehealth: Payer: Self-pay | Admitting: Orthopaedic Surgery

## 2020-11-17 ENCOUNTER — Telehealth: Payer: Self-pay

## 2020-11-17 NOTE — Telephone Encounter (Signed)
[employer] forms received. To [employer]. °

## 2020-11-17 NOTE — Telephone Encounter (Signed)
Post ED Visit - Positive Culture Follow-up  Culture report reviewed by antimicrobial stewardship pharmacist: [facility] []  , Pharm.D. []  Enzo Bi, Pharm.D., BCPS AQ-ID []  , Pharm.D., BCPS []  Celedonio Miyamoto, Pharm.D., BCPS []  [place], Garvin Fila.D., BCPS, AAHIVP []  , Pharm.D., BCPS, AAHIVP []  Georgina Pillion, PharmD, BCPS []  , PharmD, BCPS []  [place], PharmD, BCPS []  [address], PharmD []  , PharmD, BCPS []  Estella Husk, PharmD  Pharmacy Team []  Lysle Pearl, PharmD []  , PharmD []  Phillips Climes, PharmD []  , Rph []  Agapito Games) , PharmD []  Verlan Friends, PharmD []  , PharmD []  Mervyn Gay, PharmD []  , PharmD []  Vinnie Level, PharmD []  [facility], PharmD []  , PharmD []  Len Childs, PharmD   Positive urine culture Treated with Levofloxacin, organism sensitive to the same and no further patient follow-up is required at this time.  11/17/2020, 10:43 AM

## 2020-11-18 ENCOUNTER — Other Ambulatory Visit: Payer: Self-pay | Admitting: Orthopaedic Surgery

## 2020-11-18 ENCOUNTER — Telehealth: Payer: Self-pay | Admitting: Orthopaedic Surgery

## 2020-11-18 MED ORDER — OXYCODONE HCL 5 MG PO TABS: 5.0000 mg | ORAL_TABLET | Freq: Four times a day (QID) | ORAL | 0 refills | Status: DC | PRN

## 2020-11-18 NOTE — Telephone Encounter (Signed)
Pt asking if he can have any pain medication prescribed to him. Pt states during the day he is okay but at night he is experiencing severe pain. The best pharmacy is the one on file and call back number is [phone].

## 2020-11-19 LAB — CULTURE, BLOOD (ROUTINE X 2)
Culture: NO GROWTH
Culture: NO GROWTH
Special Requests: ADEQUATE
Special Requests: ADEQUATE

## 2020-11-28 ENCOUNTER — Encounter (HOSPITAL_COMMUNITY): Payer: Self-pay | Admitting: Emergency Medicine

## 2020-11-28 ENCOUNTER — Other Ambulatory Visit: Payer: Self-pay

## 2020-11-28 ENCOUNTER — Inpatient Hospital Stay (HOSPITAL_COMMUNITY): Payer: BC Managed Care – PPO

## 2020-11-28 ENCOUNTER — Observation Stay (HOSPITAL_COMMUNITY)
Admission: EM | Admit: 2020-11-28 | Discharge: 2020-11-29 | Disposition: A | Discharge status: Home / Self Care | Payer: BC Managed Care – PPO | Attending: Family Medicine | Admitting: Family Medicine

## 2020-11-28 DIAGNOSIS — Z96651 Presence of right artificial knee joint: Secondary | ICD-10-CM | POA: Diagnosis not present

## 2020-11-28 DIAGNOSIS — I4891 Unspecified atrial fibrillation: Secondary | ICD-10-CM | POA: Diagnosis not present

## 2020-11-28 DIAGNOSIS — Z7984 Long term (current) use of oral hypoglycemic drugs: Secondary | ICD-10-CM | POA: Insufficient documentation

## 2020-11-28 DIAGNOSIS — I10 Essential (primary) hypertension: Secondary | ICD-10-CM | POA: Diagnosis not present

## 2020-11-28 DIAGNOSIS — E785 Hyperlipidemia, unspecified: Secondary | ICD-10-CM | POA: Diagnosis present

## 2020-11-28 DIAGNOSIS — U071 COVID-19: Secondary | ICD-10-CM | POA: Insufficient documentation

## 2020-11-28 DIAGNOSIS — R000 Tachycardia, unspecified: Secondary | ICD-10-CM | POA: Diagnosis present

## 2020-11-28 DIAGNOSIS — E119 Type 2 diabetes mellitus without complications: Secondary | ICD-10-CM

## 2020-11-28 DIAGNOSIS — Z79899 Other long term (current) drug therapy: Secondary | ICD-10-CM | POA: Insufficient documentation

## 2020-11-28 DIAGNOSIS — Z7982 Long term (current) use of aspirin: Secondary | ICD-10-CM | POA: Diagnosis not present

## 2020-11-28 LAB — CBC
HCT: 37.2 % — ABNORMAL LOW (ref 39.0–52.0)
Hemoglobin: 12.4 g/dL — ABNORMAL LOW (ref 13.0–17.0)
MCH: 29.4 pg (ref 26.0–34.0)
MCHC: 33.3 g/dL (ref 30.0–36.0)
MCV: 88.2 fL (ref 80.0–100.0)
Platelets: 236 10*3/uL (ref 150–400)
RBC: 4.22 MIL/uL (ref 4.22–5.81)
RDW: 12.5 % (ref 11.5–15.5)
WBC: 5.2 10*3/uL (ref 4.0–10.5)
nRBC: 0 % (ref 0.0–0.2)

## 2020-11-28 LAB — BRAIN NATRIURETIC PEPTIDE: B Natriuretic Peptide: 64 pg/mL (ref 0.0–100.0)

## 2020-11-28 LAB — MAGNESIUM: Magnesium: 1.8 mg/dL (ref 1.7–2.4)

## 2020-11-28 LAB — LIPID PANEL
Cholesterol: 75 mg/dL (ref 0–200)
HDL: 20 mg/dL — ABNORMAL LOW (ref >40)
LDL Cholesterol: 40 mg/dL (ref 0–99)
Total CHOL/HDL Ratio: 3.8 RATIO
Triglycerides: 76 mg/dL (ref <150)
VLDL: 15 mg/dL (ref 0–40)

## 2020-11-28 LAB — BASIC METABOLIC PANEL
Anion gap: 10 (ref 5–15)
BUN: 11 mg/dL (ref 6–20)
CO2: 21 mmol/L — ABNORMAL LOW (ref 22–32)
Calcium: 8 mg/dL — ABNORMAL LOW (ref 8.9–10.3)
Chloride: 106 mmol/L (ref 98–111)
Creatinine, Ser: 0.87 mg/dL (ref 0.61–1.24)
GFR, Estimated: >60 mL/min (ref >60)
Glucose, Bld: 155 mg/dL — ABNORMAL HIGH (ref 70–99)
Potassium: 3.4 mmol/L — ABNORMAL LOW (ref 3.5–5.1)
Sodium: 137 mmol/L (ref 135–145)

## 2020-11-28 LAB — HEMOGLOBIN A1C
Hgb A1c MFr Bld: 7 % — ABNORMAL HIGH (ref 4.8–5.6)
Mean Plasma Glucose: 154.2 mg/dL

## 2020-11-28 LAB — ECHOCARDIOGRAM COMPLETE
AR max vel: 2.69 cm2
AV Area VTI: 3.05 cm2
AV Area mean vel: 2.85 cm2
AV Mean grad: 3 mmHg
AV Peak grad: 5.3 mmHg
Ao pk vel: 1.15 m/s
Area-P 1/2: 1.51 cm2
Height: 75 in
MV VTI: 2.31 cm2
S' Lateral: 3.16 cm
Weight: 5024 oz

## 2020-11-28 LAB — TSH: TSH: 1.025 u[IU]/mL (ref 0.350–4.500)

## 2020-11-28 LAB — RESP PANEL BY RT-PCR (FLU A&B, COVID) ARPGX2
Influenza A by PCR: NEGATIVE
Influenza B by PCR: NEGATIVE
SARS Coronavirus 2 by RT PCR: POSITIVE — AB

## 2020-11-28 LAB — CBG MONITORING, ED
Glucose-Capillary: 148 mg/dL — ABNORMAL HIGH (ref 70–99)
Glucose-Capillary: 121 mg/dL — ABNORMAL HIGH (ref 70–99)
Glucose-Capillary: 177 mg/dL — ABNORMAL HIGH (ref 70–99)

## 2020-11-28 LAB — HIV ANTIBODY (ROUTINE TESTING W REFLEX): HIV Screen 4th Generation wRfx: NONREACTIVE

## 2020-11-28 MED ORDER — HYDRALAZINE HCL 20 MG/ML IJ SOLN: 10.0000 mg | INTRAMUSCULAR | Status: DC | PRN

## 2020-11-28 MED ORDER — ONDANSETRON HCL 4 MG PO TABS: 4.0000 mg | ORAL_TABLET | Freq: Four times a day (QID) | ORAL | Status: DC | PRN

## 2020-11-28 MED ORDER — DILTIAZEM HCL 30 MG PO TABS
60.0000 mg | ORAL_TABLET | Freq: Two times a day (BID) | ORAL | Status: DC
  Administered 2020-11-28: 60 mg via ORAL
  Filled 2020-11-28: qty 2

## 2020-11-28 MED ORDER — METFORMIN HCL 500 MG PO TABS
1000.0000 mg | ORAL_TABLET | Freq: Every day | ORAL | Status: DC
  Administered 2020-11-28 – 2020-11-29 (×2): 1000 mg via ORAL
  Filled 2020-11-28 (×3): qty 2

## 2020-11-28 MED ORDER — SODIUM CHLORIDE 0.9 % IV SOLN: INTRAVENOUS | Status: DC

## 2020-11-28 MED ORDER — ACETAMINOPHEN 650 MG RE SUPP: 650.0000 mg | Freq: Four times a day (QID) | RECTAL | Status: DC | PRN

## 2020-11-28 MED ORDER — IPRATROPIUM BROMIDE 0.02 % IN SOLN: 0.5000 mg | Freq: Four times a day (QID) | RESPIRATORY_TRACT | Status: DC | PRN

## 2020-11-28 MED ORDER — POTASSIUM CHLORIDE CRYS ER 20 MEQ PO TBCR
40.0000 meq | EXTENDED_RELEASE_TABLET | Freq: Once | ORAL | Status: AC
  Administered 2020-11-28: 40 meq via ORAL
  Filled 2020-11-28: qty 2

## 2020-11-28 MED ORDER — HYDROMORPHONE HCL 1 MG/ML IJ SOLN: 0.5000 mg | INTRAMUSCULAR | Status: DC | PRN

## 2020-11-28 MED ORDER — ONDANSETRON HCL 4 MG/2ML IJ SOLN: 4.0000 mg | Freq: Four times a day (QID) | INTRAMUSCULAR | Status: DC | PRN

## 2020-11-28 MED ORDER — APIXABAN 5 MG PO TABS
5.0000 mg | ORAL_TABLET | Freq: Two times a day (BID) | ORAL | Status: DC
Start: 2020-11-28 — End: 2020-11-29
  Administered 2020-11-28 – 2020-11-29 (×3): 5 mg via ORAL
  Filled 2020-11-28 (×3): qty 1

## 2020-11-28 MED ORDER — LEVALBUTEROL HCL 0.63 MG/3ML IN NEBU: 0.6300 mg | INHALATION_SOLUTION | Freq: Four times a day (QID) | RESPIRATORY_TRACT | Status: DC | PRN

## 2020-11-28 MED ORDER — SENNOSIDES-DOCUSATE SODIUM 8.6-50 MG PO TABS: 1.0000 | ORAL_TABLET | Freq: Every evening | ORAL | Status: DC | PRN

## 2020-11-28 MED ORDER — ACETAMINOPHEN 325 MG PO TABS
650.0000 mg | ORAL_TABLET | Freq: Four times a day (QID) | ORAL | Status: DC | PRN
  Filled 2020-11-28: qty 2

## 2020-11-28 MED ORDER — ASPIRIN EC 81 MG PO TBEC
81.0000 mg | DELAYED_RELEASE_TABLET | Freq: Every day | ORAL | Status: DC
  Administered 2020-11-29: 81 mg via ORAL
  Filled 2020-11-28 (×2): qty 1

## 2020-11-28 MED ORDER — SODIUM CHLORIDE 0.9 % IV SOLN: 250.0000 mL | INTRAVENOUS | Status: DC | PRN

## 2020-11-28 MED ORDER — HEPARIN SODIUM (PORCINE) 5000 UNIT/ML IJ SOLN: 5000.0000 [IU] | Freq: Three times a day (TID) | INTRAMUSCULAR | Status: DC

## 2020-11-28 MED ORDER — METOPROLOL TARTRATE 5 MG/5ML IV SOLN
5.0000 mg | INTRAVENOUS | Status: DC | PRN
Start: 2020-11-28 — End: 2020-11-29
  Administered 2020-11-28 (×2): 5 mg via INTRAVENOUS
  Filled 2020-11-28 (×2): qty 5

## 2020-11-28 MED ORDER — OXYCODONE HCL 5 MG PO TABS: 5.0000 mg | ORAL_TABLET | ORAL | Status: DC | PRN

## 2020-11-28 MED ORDER — SODIUM CHLORIDE 0.9% FLUSH: 3.0000 mL | Freq: Two times a day (BID) | INTRAVENOUS | Status: DC

## 2020-11-28 MED ORDER — DILTIAZEM HCL-DEXTROSE 125-5 MG/125ML-% IV SOLN (PREMIX)
5.0000 mg/h | INTRAVENOUS | Status: DC
  Administered 2020-11-28: 5 mg/h via INTRAVENOUS
  Filled 2020-11-28: qty 125

## 2020-11-28 MED ORDER — INSULIN ASPART 100 UNIT/ML IJ SOLN
0.0000 [IU] | Freq: Three times a day (TID) | INTRAMUSCULAR | Status: DC
  Administered 2020-11-28: 1 [IU] via SUBCUTANEOUS
  Administered 2020-11-28: 2 [IU] via SUBCUTANEOUS
  Filled 2020-11-28 (×2): qty 1

## 2020-11-28 MED ORDER — SODIUM CHLORIDE 0.9 % IV BOLUS
1000.0000 mL | Freq: Once | INTRAVENOUS | Status: AC
  Administered 2020-11-28: 1000 mL via INTRAVENOUS

## 2020-11-28 MED ORDER — SODIUM CHLORIDE 0.9% FLUSH: 3.0000 mL | INTRAVENOUS | Status: DC | PRN

## 2020-11-28 MED ORDER — MAGNESIUM SULFATE 2 GM/50ML IV SOLN
2.0000 g | Freq: Once | INTRAVENOUS | Status: AC
  Administered 2020-11-28: 2 g via INTRAVENOUS
  Filled 2020-11-28: qty 50

## 2020-11-28 MED ORDER — METOPROLOL SUCCINATE ER 50 MG PO TB24
100.0000 mg | ORAL_TABLET | Freq: Every day | ORAL | Status: DC
  Administered 2020-11-28 – 2020-11-29 (×2): 100 mg via ORAL
  Filled 2020-11-28 (×2): qty 2

## 2020-11-28 MED ORDER — METOPROLOL TARTRATE 50 MG PO TABS: 50.0000 mg | ORAL_TABLET | Freq: Two times a day (BID) | ORAL | Status: DC

## 2020-11-28 MED ORDER — ATORVASTATIN CALCIUM 10 MG PO TABS
20.0000 mg | ORAL_TABLET | Freq: Every day | ORAL | Status: DC
  Administered 2020-11-28: 20 mg via ORAL
  Filled 2020-11-28 (×2): qty 2

## 2020-11-28 MED ORDER — BISACODYL 5 MG PO TBEC: 5.0000 mg | DELAYED_RELEASE_TABLET | Freq: Every day | ORAL | Status: DC | PRN

## 2020-11-28 NOTE — Progress Notes (Signed)
*  PRELIMINARY RESULTS* Echocardiogram 2D Echocardiogram has been performed.  Carolyne Fiscal 11/28/2020, 11:17 AM

## 2020-11-28 NOTE — Progress Notes (Signed)
ANTICOAGULATION CONSULT NOTE - Initial Consult  Pharmacy Consult for eliquis Indication: atrial fibrillation  Allergies  Allergen Reactions   Penicillins Other (See Comments)    Has patient had a PCN reaction causing immediate rash, facial/tongue/throat swelling, SOB or lightheadedness with hypotension: Unknown Has patient had a PCN reaction causing severe rash involving mucus membranes or skin necrosis: Unknown Has patient had a PCN reaction that required hospitalization: Unknown Has patient had a PCN reaction occurring within the last 10 years: No Childhood reaction If all of the above answers are "NO", then may proceed with Cephalosporin use.     Patient Measurements: Height: 6\' 3"  (190.5 cm) Weight: (!) 142.4 kg (314 lb) IBW/kg (Calculated) : 84.5  Vital Signs: Temp: 98.1 F (36.7 C) (08/21 0354) Temp Source: Oral (08/21 0354) BP: 120/69 (08/21 0826) Pulse Rate: 71 (08/21 0826)  Labs: Recent Labs    11/28/20 0507  HGB 12.4*  HCT 37.2*  PLT 236  CREATININE 0.87    Estimated Creatinine Clearance: 156.5 mL/min (by C-G formula based on SCr of 0.87 mg/dL).   Medical History: Past Medical History:  Diagnosis Date   Arthritis    Diabetes mellitus without complication (HCC)    GERD (gastroesophageal reflux disease)    Gout    History of kidney stones    Hypertension    Pneumonia    several years ago    Medications:  See med rec  Assessment: 50 year old male with history of diabetes, kidney stones, hypertension, recent history of tachycardia who presents with tachycardia/palpitations. New onset afib. Pharmacy asked to start eliquis.  Goal of Therapy:   Monitor platelets by anticoagulation protocol: Yes   Plan:  Eliquis 5mg  po bid Educate on eliquis Monitor for S/S of bleeding  55, BS , BCPS Clinical Pharmacist Pager [phone] 11/28/2020,8:30 AM

## 2020-11-28 NOTE — ED Provider Notes (Signed)
[facility]Y [facility] Provider Note   CSN: [id number] Arrival date & time: 11/28/20  0335     History Chief Complaint  Patient presents with   Atrial Fibrillation    [name] LoftyRichard W [name] is a 50 y.o. male.  HPI     This 50 year old male with history of diabetes, kidney stones, hypertension, recent history of tachycardia who presents with tachycardia.  Patient had a knee replacement on 7/22.  Since that time he has had multiple evaluations in the ED and by cardiology for intermittent tachycardia.  He states "I do not feel like anything is helping."  He has followed up with cardiology as an outpatient who adjusted his metoprolol from 12.5 mg once daily to twice daily.  He has had full work-up including cardiac enzymes and evaluation for PE/DVT which have been reassuring.  He also had thyroid testing which was reassuring.  Patient states that tonight he got up to go the bathroom and came back and laid down and felt significant palpitations.  Heart rate was in the 150s.  Per EMS he appeared to be in atrial fibrillation and they started Cardizem.  Denies any chest pain for me at this time.  No recent illnesses or fevers  Chart reviewed.  Negative DVT PE study.  Followed by cardiology at [facility].  Past Medical History:  Diagnosis Date   Arthritis    Diabetes mellitus without complication (HCC)    GERD (gastroesophageal reflux disease)    Gout    History of kidney stones    Hypertension    Pneumonia    several years ago    Patient Active Problem List   Diagnosis Date Noted   Sinus tachycardia 11/12/2020   Dehydration 11/12/2020   Status post right knee replacement 10/29/2020   Morbid (severe) obesity due to excess calories (HCC) 12/18/2019   Unilateral primary osteoarthritis, right knee 11/08/2016    Past Surgical History:  Procedure Laterality Date   CYSTOSCOPY/RETROGRADE/URETEROSCOPY/STONE EXTRACTION WITH BASKET Bilateral 08/29/2016   Procedure:  CYSTOSCOPY/BILATERAL RETEROGRADE BILATERTAL URETEROSCOPY/STONE EXTRACTION WITH BASKET, RIGHT STENT PLACEMENT, HOLIUM LASER;  Surgeon: Bjorn PippinWrenn, John, MD;  Location: [facility];  Service: Urology;  Laterality: Bilateral;   KNEE SURGERY     x 4   STONE EXTRACTION WITH BASKET     STRABISMUS SURGERY Right 04/02/2015   Procedure: REPAIR STRABISMUS RIGHT EYE;  Surgeon: Verne CarrowWilliam Young, MD;  Location: [facility];  Service: Ophthalmology;  Laterality: Right;   TOTAL KNEE ARTHROPLASTY Right 10/29/2020   Procedure: RIGHT TOTAL KNEE ARTHROPLASTY;  Surgeon: Kathryne HitchBlackman, Christopher Y, MD;  Location: [facility];  Service: Orthopedics;  Laterality: Right;       History reviewed. No pertinent family history.  Social History   Tobacco Use   Smoking status: Never   Smokeless tobacco: Never  Vaping Use   Vaping Use: Never used  Substance Use Topics   Alcohol use: No   Drug use: No    Home Medications Prior to Admission medications   Medication Sig Start Date End Date Taking? Authorizing Provider  acetaminophen (TYLENOL) 650 MG CR tablet Take 1,300 mg by mouth every 8 (eight) hours as needed for pain.    [provider]  amLODipine (NORVASC) 5 MG tablet Take 5 mg by mouth daily.    [provider]  aspirin 81 MG chewable tablet Chew 1 tablet (81 mg total) by mouth 2 (two) times daily. 10/30/20   Kathryne HitchBlackman, Christopher Y, MD  atorvastatin (LIPITOR) 20 MG tablet Take 20  mg by mouth daily.    [provider]  fluticasone (FLONASE) 50 MCG/ACT nasal spray Place 1 spray into both nostrils 2 (two) times daily as needed for allergies or rhinitis.    [provider]  levofloxacin (LEVAQUIN) 750 MG tablet Take 1 tablet (750 mg total) by mouth daily. 11/14/20   Eber Hong, MD  metFORMIN (GLUCOPHAGE) 500 MG tablet Take 1,000 mg by mouth at bedtime.    [provider]  methocarbamol (ROBAXIN) 500 MG tablet Take 1 tablet (500 mg total) by mouth every 6 (six) hours as  needed for muscle spasms. Patient not taking: No sig reported 10/30/20   Kathryne Hitch, MD  metoprolol succinate (TOPROL-XL) 25 MG 24 hr tablet Take 12.5 mg by mouth daily.    [provider]  oxyCODONE (OXY IR/ROXICODONE) 5 MG immediate release tablet Take 1-2 tablets (5-10 mg total) by mouth every 6 (six) hours as needed for moderate pain (pain score 4-6). 11/18/20   Kathryne Hitch, MD    Allergies    Penicillins  Review of Systems   Review of Systems  Constitutional:  Positive for diaphoresis. Negative for fever.  Respiratory:  Negative for shortness of breath.   Cardiovascular:  Positive for palpitations. Negative for chest pain.  Gastrointestinal:  Negative for abdominal pain.  Genitourinary:  Negative for dysuria.  All other systems reviewed and are negative.  Physical Exam Updated Vital Signs BP 123/79   Pulse 86   Temp 98.1 F (36.7 C) (Oral)   Resp 17   Ht 1.905 m (6\' 3" )   Wt (!) 142.4 kg   SpO2 97%   BMI 39.25 kg/m   Physical Exam Vitals and nursing note reviewed.  Constitutional:      Appearance: He is well-developed. He is obese. He is not ill-appearing.  HENT:     Head: Normocephalic and atraumatic.  Eyes:     Pupils: Pupils are equal, round, and reactive to light.  Cardiovascular:     Rate and Rhythm: Regular rhythm. Tachycardia present.     Heart sounds: Normal heart sounds. No murmur heard. Pulmonary:     Effort: Pulmonary effort is normal. No respiratory distress.     Breath sounds: Normal breath sounds. No wheezing.  Abdominal:     General: Bowel sounds are normal.     Palpations: Abdomen is soft.     Tenderness: There is no abdominal tenderness. There is no rebound.  Musculoskeletal:     Cervical back: Neck supple.     Right lower leg: No edema.     Left lower leg: No edema.     Comments: Well-healing anterior right knee incision  Lymphadenopathy:     Cervical: No cervical adenopathy.  Skin:    General: Skin is  warm and dry.     Comments: Bruising right lower extremity  Neurological:     Mental Status: He is alert and oriented to person, place, and time.  Psychiatric:        Mood and Affect: Mood normal.    ED Results / Procedures / Treatments   Labs (all labs ordered are listed, but only abnormal results are displayed) Labs Reviewed  BASIC METABOLIC PANEL - Abnormal; Notable for the following components:      Result Value   Potassium 3.4 (*)    CO2 21 (*)    Glucose, Bld 155 (*)    Calcium 8.0 (*)    All other components within normal limits  CBC - Abnormal;  Notable for the following components:   Hemoglobin 12.4 (*)    HCT 37.2 (*)    All other components within normal limits  RESP PANEL BY RT-PCR (FLU A&B, COVID) ARPGX2  MAGNESIUM    EKG EKG Interpretation  Date/Time:  Sunday November 28 2020 04:47:12 EDT Ventricular Rate:  127 PR Interval:    QRS Duration: 98 QT Interval:  321 QTC Calculation: 467 R Axis:   85 Text Interpretation: Atrial fibrillation Ventricular premature complex Confirmed by Ross Marcus ([id number]) on 11/28/2020 5:28:59 AM  Radiology No results found.  Procedures .Critical Care  Date/Time: 11/28/2020 6:08 AM Performed by: Shon Baton, MD Authorized by: Shon Baton, MD   Critical care provider statement:    Critical care time (minutes):  45   Critical care was necessary to treat or prevent imminent or life-threatening deterioration of the following conditions:  Cardiac failure   Critical care was time spent personally by me on the following activities:  Discussions with consultants, evaluation of patient's response to treatment, examination of patient, ordering and performing treatments and interventions, ordering and review of laboratory studies, ordering and review of radiographic studies, pulse oximetry, re-evaluation of patient's condition, obtaining history from patient or surrogate and review of old charts   Medications Ordered in  ED Medications  metoprolol tartrate (LOPRESSOR) injection 5 mg (5 mg Intravenous Given 11/28/20 0446)  diltiazem (CARDIZEM) 125 mg in dextrose 5% 125 mL (1 mg/mL) infusion (5 mg/hr Intravenous New Bag/Given 11/28/20 0538)  sodium chloride 0.9 % bolus 1,000 mL (0 mLs Intravenous Stopped 11/28/20 0531)    ED Course  I have reviewed the triage vital signs and the nursing notes.  Pertinent labs & imaging results that were available during my care of the patient were reviewed by me and considered in my medical decision making (see chart for details).  Clinical Course as of 11/28/20 1093  Wynelle Link Nov 28, 2020  2355 Patient given 2 boluses of 5 mg of IV metoprolol.  With these boluses, rate slowed down enough to clearly showed atrial fibrillation.  Suspect his prior episodes of tachycardia may have had some underlying atrial arrhythmia as well.  Given that his rate is not stably controlled with bolus metoprolol, will start Cardizem drip.  Feel he warrants admission at this time for rate control and cardiology evaluation. [CH]    Clinical Course User Index [CH] Davis Ambrosini, Mayer Masker, MD   MDM Rules/Calculators/A&P                           Presents with recurrent tachycardia.  EMS felt he was in atrial fibrillation with RVR.  Initial EKG here was difficult to determine whether he was in an ectopic atrial rhythm versus atrial fibrillation.  On a diltiazem drip, he remained in the 150s.  I cut the drip off and gave him 2 boluses of metoprolol to try to get better rate control to see the underlying rhythm.  Ultimately it does appear he is in atrial fibrillation.  After 2 doses of metoprolol his rates rebounded into the 150s.  We will start a diltiazem drip.  He is not currently anticoagulated so is not a great candidate for cardioversion and presumptively he has been in and out of atrial fibrillation over the last several weeks.  Labs obtained.  Potassium 3.4.  Magnesium 1.8.  CBC reassuring.  He is not having any  chest pain and recent chest x-ray and CT PE study have  been reassuring.  These were not reordered.  We will plan for admission for titration of rate controlling medication and evaluation by cardiology.  Final Clinical Impression(s) / ED Diagnoses Final diagnoses:  Atrial fibrillation with RVR [facility])    Rx / DC Orders ED Discharge Orders     None        Shon Baton, MD 11/28/20 [phone]

## 2020-11-28 NOTE — Evaluation (Signed)
Physical Therapy Evaluation Patient Details Name: [name] [name] MRN: [id number] DOB: Apr 24, 1970 Today's Date: 11/28/2020   History of Present Illness  [name] [name] is a 50 y.o. male with medical history significant of   HTN, HLD, DM2, GERD, H/of kidney stones, history of tachycardia, presenting with palpitation.  He has had multiple visit to the ED for tachycardia post knee surgery has had work-up including cardiac enzymes, DVT PE evaluation, thyroid, and outpatient cardiology evaluation.  Management of minimal exertion, ambulation noted palpitation, his heart rate was as high as 150.  EMS was called, patient was found in A. fib, Cardizem was started..  Patient was brought to the ED. POSITIVE FOR NOVEL CORONA VIRUS  Clinical Impression  Present in room and agreeable to PT. States he hasnt done any outpatient PT exercises in the hospital as he is hooked up to machines. Able to transfer in/out of bed independently, stand pivot transfer independently, and ambulate with MinGuard to supervision. RN notified of mobility status. Discharged from pt. Patient discharged to care of nursing for ambulation daily as tolerated for length of stay.     Follow Up Recommendations Outpatient PT    Equipment Recommendations  None recommended by PT    Recommendations for Other Services       Precautions / Restrictions Precautions Precautions: Fall;Knee Restrictions Weight Bearing Restrictions: No      Mobility  Bed Mobility Overal bed mobility: Independent                  Transfers Overall transfer level: Independent                  Ambulation/Gait Ambulation/Gait assistance: Modified independent (Device/Increase time);Supervision Gait Distance (Feet): 20 Feet   Gait Pattern/deviations: Step-to pattern;Decreased step length - right;Decreased stance time - left;Decreased stance time - right;Decreased step length - left Gait velocity: decreased      Stairs             Wheelchair Mobility    Modified Rankin (Stroke Patients Only)       Balance Overall balance assessment: Mild deficits observed, not formally tested                                           Pertinent Vitals/Pain Pain Assessment: No/denies pain    Home Living Family/patient expects to be discharged to:: Private residence Living Arrangements: Spouse/significant other Available Help at Discharge: Family Type of Home: House Home Access: Stairs to enter       Home Equipment: Environmental consultant - 2 wheels;Cane - single point;Grab bars - toilet;Toilet riser;Shower seat      Prior Function Level of Independence: Independent with assistive device(s)               Hand Dominance        Extremity/Trunk Assessment   Upper Extremity Assessment Upper Extremity Assessment: Overall WFL for tasks assessed    Lower Extremity Assessment Lower Extremity Assessment: Generalized weakness (secondary to post-op TKA)    Cervical / Trunk Assessment Cervical / Trunk Assessment: Normal  Communication   Communication: No difficulties  Cognition Arousal/Alertness: Awake/alert Behavior During Therapy: WFL for tasks assessed/performed Overall Cognitive Status: Within Functional Limits for tasks assessed  General Comments      Exercises     Assessment/Plan    PT Assessment Patent does not need any further PT services;All further PT needs can be met in the next venue of care  PT Problem List Decreased strength;Decreased range of motion;Decreased activity tolerance;Decreased balance;Decreased mobility       PT Treatment Interventions      PT Goals (Current goals can be found in the Care Plan section)  Acute Rehab PT Goals Patient Stated Goal: return home PT Goal Formulation: With patient Time For Goal Achievement: 12/12/20 Potential to Achieve Goals: Good    Frequency     Barriers to discharge         Co-evaluation               AM-PAC PT "6 Clicks" Mobility  Outcome Measure Help needed turning from your back to your side while in a flat bed without using bedrails?: None Help needed moving from lying on your back to sitting on the side of a flat bed without using bedrails?: None Help needed moving to and from a bed to a chair (including a wheelchair)?: None Help needed standing up from a chair using your arms (e.g., wheelchair or bedside chair)?: A Little Help needed to walk in hospital room?: A Little Help needed climbing 3-5 steps with a railing? : A Little 6 Click Score: 21    End of Session   Activity Tolerance: Patient tolerated treatment well Patient left: Other (comment) (on [facility], RN notified attempting BM) Nurse Communication: Mobility status;Other (comment) (toilet paper required) PT Visit Diagnosis: Unsteadiness on feet (R26.81);Other abnormalities of gait and mobility (R26.89);Muscle weakness (generalized) (M62.81)    Time: 1240-1250 PT Time Calculation (min) (ACUTE ONLY): 10 min   Charges:   PT Evaluation $PT Eval Low Complexity: 1 Low          2:01 PM,11/28/20 Domenic Moras, PT, DPT Physical Therapist at [facility]

## 2020-11-28 NOTE — H&P (Signed)
History and Physical   Patient: [name] [name]                            PCP: [facility]                    DOB: 10/03/1970            DOA: 11/28/2020 ZOX:[id number]             DOS: 11/28/2020, 8:10 AM  [facility]  Patient coming from:   HOME  I have personally reviewed patient's medical records, in electronic medical records, including:  [facility], and care everywhere.    Chief Complaint:   Chief Complaint  Patient presents with   Atrial Fibrillation    History of present illness:    [name] LoftyRichard W Gavidia is a 50 y.o. male with medical history significant of   HTN, HLD, DM2, GERD, H/of kidney stones, history of tachycardia, presenting with palpitation. He has had multiple visit to the ED for tachycardia post knee surgery has had work-up including cardiac enzymes, DVT PE evaluation, thyroid, and outpatient cardiology evaluation. Management of minimal exertion, ambulation noted palpitation, his heart rate was as high as 150. EMS was called, patient was found in A. fib, Cardizem was started..  Patient was brought to the ED    Patient Denies having: Fever, Chills, Cough, SOB, Chest Pain, Abd pain, N/V/D, headache, dizziness, lightheadedness,  Dysuria, Joint pain, rash, open wounds  ED Course:   Vitals:   11/28/20 0630 11/28/20 0730  BP: 112/74 114/65  Pulse: 88 73  Resp: 20 14  Temp:    SpO2: 96% 99%   Abnormal labs; CBC BMP within normal notes exception of potassium 3.4, glucose 135, Chart review nephro DVT PE, [facility]  In ED to bolus of 5 mg IV metoprolol was given, started on Cardizem drip,  Review of Systems: As per HPI, otherwise 10 point review of systems were negative.   ----------------------------------------------------------------------------------------------------------------------  Allergies  Allergen Reactions   Penicillins Other (See Comments)    Has patient had a PCN reaction causing  immediate rash, facial/tongue/throat swelling, SOB or lightheadedness with hypotension: Unknown Has patient had a PCN reaction causing severe rash involving mucus membranes or skin necrosis: Unknown Has patient had a PCN reaction that required hospitalization: Unknown Has patient had a PCN reaction occurring within the last 10 years: No Childhood reaction If all of the above answers are "NO", then may proceed with Cephalosporin use.     Home MEDs:  Prior to Admission medications   Medication Sig Start Date End Date Taking? Authorizing Provider  acetaminophen (TYLENOL) 650 MG CR tablet Take 1,300 mg by mouth every 8 (eight) hours as needed for pain.    [provider]  amLODipine (NORVASC) 5 MG tablet Take 5 mg by mouth daily.    [provider]  aspirin 81 MG chewable tablet Chew 1 tablet (81 mg total) by mouth 2 (two) times daily. 10/30/20   Kathryne HitchBlackman, Christopher Y, MD  atorvastatin (LIPITOR) 20 MG tablet Take 20 mg by mouth daily.    [provider]  fluticasone (FLONASE) 50 MCG/ACT nasal spray Place 1 spray into both nostrils 2 (two) times daily as needed for allergies or rhinitis.    [provider]  levofloxacin (LEVAQUIN) 750 MG tablet Take 1 tablet (750 mg total) by mouth daily. 11/14/20   Eber HongMiller, Brian, MD  metFORMIN (GLUCOPHAGE) 500 MG tablet Take 1,000 mg by mouth at bedtime.    [provider]  methocarbamol (ROBAXIN) 500 MG tablet Take 1 tablet (500 mg total) by mouth every 6 (six) hours as needed for muscle spasms. Patient not taking: No sig reported 10/30/20   Kathryne Hitch, MD  metoprolol succinate (TOPROL-XL) 25 MG 24 hr tablet Take 12.5 mg by mouth daily.    [provider]  oxyCODONE (OXY IR/ROXICODONE) 5 MG immediate release tablet Take 1-2 tablets (5-10 mg total) by mouth every 6 (six) hours as needed for moderate pain (pain score 4-6). 11/18/20   Kathryne Hitch, MD    PRN MEDs: sodium chloride,  acetaminophen **OR** acetaminophen, bisacodyl, hydrALAZINE, HYDROmorphone (DILAUDID) injection, ipratropium, levalbuterol, metoprolol tartrate, ondansetron **OR** ondansetron (ZOFRAN) IV, oxyCODONE, senna-docusate, sodium chloride flush  Past Medical History:  Diagnosis Date   Arthritis    Diabetes mellitus without complication (HCC)    GERD (gastroesophageal reflux disease)    Gout    History of kidney stones    Hypertension    Pneumonia    several years ago    Past Surgical History:  Procedure Laterality Date   CYSTOSCOPY/RETROGRADE/URETEROSCOPY/STONE EXTRACTION WITH BASKET Bilateral 08/29/2016   Procedure: CYSTOSCOPY/BILATERAL RETEROGRADE BILATERTAL URETEROSCOPY/STONE EXTRACTION WITH BASKET, RIGHT STENT PLACEMENT, HOLIUM LASER;  Surgeon: Bjorn Pippin, MD;  Location: [facility];  Service: Urology;  Laterality: Bilateral;   KNEE SURGERY     x 4   STONE EXTRACTION WITH BASKET     STRABISMUS SURGERY Right 04/02/2015   Procedure: REPAIR STRABISMUS RIGHT EYE;  Surgeon: Verne Carrow, MD;  Location: [facility];  Service: Ophthalmology;  Laterality: Right;   TOTAL KNEE ARTHROPLASTY Right 10/29/2020   Procedure: RIGHT TOTAL KNEE ARTHROPLASTY;  Surgeon: Kathryne Hitch, MD;  Location: [facility];  Service: Orthopedics;  Laterality: Right;     reports that he has never smoked. He has never used smokeless tobacco. He reports that he does not drink alcohol and does not use drugs.   History reviewed. No pertinent family history.  Physical Exam:   Vitals:   11/28/20 0610 11/28/20 0615 11/28/20 0630 11/28/20 0730  BP:   112/74 114/65  Pulse: 87 84 88 73  Resp: 14 (!) 22 20 14   Temp:      TempSrc:      SpO2: 98% 98% 96% 99%  Weight:      Height:       Constitutional: NAD, calm, comfortable Eyes: PERRL, lids and conjunctivae normal ENMT: Mucous membranes are moist. Posterior pharynx clear of any exudate or lesions.Normal dentition.  Neck: normal, supple, no masses, no  thyromegaly Respiratory: clear to auscultation bilaterally, no wheezing, no crackles. Normal respiratory effort. No accessory muscle use.  Cardiovascular: Irregularly irregular, no murmurs / rubs / gallops. No extremity edema. 2+ pedal pulses. No carotid bruits.  Abdomen: no tenderness, no masses palpated. No hepatosplenomegaly. Bowel sounds positive.  Musculoskeletal: no clubbing / cyanosis. No joint deformity upper and lower extremities. Good ROM, no contractures. Normal muscle tone.  Neurologic: CN II-XII grossly intact. Sensation intact, DTR normal. Strength 5/5 in all 4.  Psychiatric: Normal judgment and insight. Alert and oriented x 3. Normal mood.  Skin: no rashes, lesions, ulcers. No induration Decubitus/ulcers:  Wounds: per nursing documentation         Labs on admission:    I have personally reviewed following labs and imaging studies  CBC: Recent Labs  Lab 11/28/20 0507  WBC 5.2  HGB 12.4*  HCT 37.2*  MCV 88.2  PLT 236   Basic Metabolic Panel: Recent Labs  Lab 11/28/20 0507  NA 137  K 3.4*  CL 106  CO2 21*  GLUCOSE 155*  BUN 11  CREATININE 0.87  CALCIUM 8.0*  MG 1.8       Component Value Date/Time   COLORURINE YELLOW 11/14/2020 1408   APPEARANCEUR CLEAR 11/14/2020 1408   LABSPEC 1.014 11/14/2020 1408   PHURINE 6.0 11/14/2020 1408   GLUCOSEU 150 (A) 11/14/2020 1408   HGBUR SMALL (A) 11/14/2020 1408   BILIRUBINUR NEGATIVE 11/14/2020 1408   KETONESUR NEGATIVE 11/14/2020 1408   PROTEINUR NEGATIVE 11/14/2020 1408   NITRITE NEGATIVE 11/14/2020 1408   LEUKOCYTESUR NEGATIVE 11/14/2020 1408     Radiologic Exams on Admission:   No results found.  EKG:   Independently reviewed.  Orders placed or performed during the hospital encounter of 11/28/20   EKG 12-Lead   EKG 12-Lead   EKG 12-Lead   EKG 12-Lead   ED EKG   ED EKG   EKG 12-Lead   EKG 12-Lead   EKG 12-Lead   EKG 12-Lead   EKG 12-Lead    ---------------------------------------------------------------------------------------------------------------------------------------    Assessment / Plan:   Principal Problem:   Atrial fibrillation with RVR (HCC) Active Problems:   HTN (hypertension)   DMII (diabetes mellitus, type 2) (HCC)   HLD (hyperlipidemia)   Morbid (severe) obesity due to excess calories (HCC)   A-fib (HCC)   Principal Problem:    Atrial fibrillation with RVR (HCC) -new onset -Admitted to telemetry bed -Continue Cardizem drip with a goal to taper down as the heart rate improves -Obtaining labs including TSH, -Repleting electrolytes magnesium, potassium -2D echocardiogram -We will increase his metoprolol  XL to 100 mg p.o. daily -We will obtain cardiology consultation, for further evaluation recommendations  -CHA2DS2-VASc score >  2, initiating chronic anticoagulation therapy with Eliquis (Pros and cons of anticoagulation discussed with patient detail-expressed understanding)  -CTA from 11/12/2020-reviewed: IMPRESSION: 1. Negative examination for pulmonary embolism. 2. Small nonobstructive left renal calculus.  Active Problems:    HTN (hypertension)  -currently stable, his metoprolol for heart rate will be increased, will monitor, will hold his home medication of Norvasc, as his metoprolol increased in dose    DMII (diabetes mellitus, type 2) (HCC)  -continue metformin, obtaining A1c, checking his CBG q. ACH S with SSI coverage    HLD (hyperlipidemia) -Continue his home dose statins, rechecking fasting lipid panel    Morbid (severe) obesity due to excess calories (HCC) Body mass index is 39.25 kg/m. -Discussed with patient regarding healthy diet exercise and weight loss -Per patient he has been steadily losing weight intentionally  History of GERD -currently asymptomatic, with holding PPI  Osteoarthritis right knee -Status post total arthroplasty on 7/ 22 -Right knee surgical scar  healing well, no signs of infection erythema or edema    Cultures:  -none Antimicrobial: -none   Consults called: Cardiology -------------------------------------------------------------------------------------------------------------------------------------------- DVT prophylaxis: SCD/Compression stockings initiating Eliquis Code Status:   Code Status: Full Code   Admission status: Patient will be admitted as Inpatient, with a greater than 2 midnight length of stay. Level of care: Telemetry   Family Communication:  none at bedside  (The above findings and plan of care has been discussed with patient in detail, the patient expressed understanding and agreement of above plan)  --------------------------------------------------------------------------------------------------------------------------------------------------  Disposition Plan:  Anticipated 1-2 days Status is: Inpatient  Remains inpatient appropriate because:Ongoing diagnostic testing needed not appropriate for outpatient  work up and Inpatient level of care appropriate due to severity of illness  Dispo: The patient is from: Home              Anticipated d/c is to: Home              Patient currently is not medically stable to d/c.   Difficult to place patient No    ------------------------------------------------------------------------------------------------------------------------------------------------  Time spent: > than  55  Min.   SIGNED: Kendell Bane, MD, FHM. [facility],  Pager (Please use [email or web address] to page to text)  If 7PM-7AM, please contact night-coverage [email or web address],  11/28/2020, 8:10 AM

## 2020-11-28 NOTE — ED Notes (Signed)
Ambulated pt. 50 ft. Pts. O2 stayed 96 on RA.

## 2020-11-28 NOTE — ED Notes (Signed)
This nurse has attempted to call dietary x2 to get a meal tray for this pt.

## 2020-11-28 NOTE — ED Triage Notes (Signed)
Pt brought in by EMS with c/o A. Fib with RVR. Per EMS, they received a call about pt feeling dizzy and light headed. Stated when they arrived, pt was in A fib with a HR between 140 and 210. Pt was given 25mg  Cardizem Bolus followed by a Cardizem drip at 10mg /kg/hr. Pt also c/o centralized chest pain that he describes as "pressure". Denies SOB.

## 2020-11-28 NOTE — ED Notes (Signed)
Attempted to call dietary x3 no response.

## 2020-11-29 ENCOUNTER — Encounter (HOSPITAL_COMMUNITY): Payer: Self-pay | Admitting: Family Medicine

## 2020-11-29 DIAGNOSIS — I10 Essential (primary) hypertension: Secondary | ICD-10-CM

## 2020-11-29 DIAGNOSIS — E782 Mixed hyperlipidemia: Secondary | ICD-10-CM

## 2020-11-29 DIAGNOSIS — I4891 Unspecified atrial fibrillation: Secondary | ICD-10-CM | POA: Diagnosis not present

## 2020-11-29 DIAGNOSIS — M2550 Pain in unspecified joint: Secondary | ICD-10-CM | POA: Insufficient documentation

## 2020-11-29 LAB — CBC
HCT: 38 % — ABNORMAL LOW (ref 39.0–52.0)
Hemoglobin: 12.3 g/dL — ABNORMAL LOW (ref 13.0–17.0)
MCH: 29 pg (ref 26.0–34.0)
MCHC: 32.4 g/dL (ref 30.0–36.0)
MCV: 89.6 fL (ref 80.0–100.0)
Platelets: 232 10*3/uL (ref 150–400)
RBC: 4.24 MIL/uL (ref 4.22–5.81)
RDW: 12.6 % (ref 11.5–15.5)
WBC: 5.5 10*3/uL (ref 4.0–10.5)
nRBC: 0 % (ref 0.0–0.2)

## 2020-11-29 LAB — BASIC METABOLIC PANEL
Anion gap: 8 (ref 5–15)
BUN: 7 mg/dL (ref 6–20)
CO2: 26 mmol/L (ref 22–32)
Calcium: 8.7 mg/dL — ABNORMAL LOW (ref 8.9–10.3)
Chloride: 105 mmol/L (ref 98–111)
Creatinine, Ser: 0.9 mg/dL (ref 0.61–1.24)
GFR, Estimated: >60 mL/min (ref >60)
Glucose, Bld: 162 mg/dL — ABNORMAL HIGH (ref 70–99)
Potassium: 4.2 mmol/L (ref 3.5–5.1)
Sodium: 139 mmol/L (ref 135–145)

## 2020-11-29 LAB — CBG MONITORING, ED
Glucose-Capillary: 130 mg/dL — ABNORMAL HIGH (ref 70–99)
Glucose-Capillary: 141 mg/dL — ABNORMAL HIGH (ref 70–99)

## 2020-11-29 LAB — PROTIME-INR
INR: 1.1 (ref 0.8–1.2)
Prothrombin Time: 14.1 seconds (ref 11.4–15.2)

## 2020-11-29 MED ORDER — DILTIAZEM HCL ER COATED BEADS 120 MG PO CP24: 120.0000 mg | ORAL_CAPSULE | Freq: Every day | ORAL | 2 refills | Status: DC

## 2020-11-29 MED ORDER — EPINEPHRINE 0.3 MG/0.3ML IJ SOAJ: 0.3000 mg | Freq: Once | INTRAMUSCULAR | Status: DC | PRN

## 2020-11-29 MED ORDER — NIRMATRELVIR/RITONAVIR (PAXLOVID)TABLET
3.0000 | ORAL_TABLET | Freq: Two times a day (BID) | ORAL | Status: DC
Start: 2020-11-29 — End: 2020-11-29

## 2020-11-29 MED ORDER — SODIUM CHLORIDE 0.9 % IV SOLN: INTRAVENOUS | Status: DC | PRN

## 2020-11-29 MED ORDER — BEBTELOVIMAB 175 MG/2 ML IV (EUA)
175.0000 mg | Freq: Once | INTRAMUSCULAR | Status: AC
  Administered 2020-11-29: 175 mg via INTRAVENOUS
  Filled 2020-11-29: qty 2

## 2020-11-29 MED ORDER — DILTIAZEM HCL ER COATED BEADS 120 MG PO CP24
120.0000 mg | ORAL_CAPSULE | Freq: Every day | ORAL | Status: DC
  Administered 2020-11-29: 120 mg via ORAL
  Filled 2020-11-29: qty 1

## 2020-11-29 MED ORDER — APIXABAN 5 MG PO TABS: 5.0000 mg | ORAL_TABLET | Freq: Two times a day (BID) | ORAL | 3 refills | Status: DC

## 2020-11-29 MED ORDER — METOPROLOL SUCCINATE ER 100 MG PO TB24: 100.0000 mg | ORAL_TABLET | Freq: Every day | ORAL | 2 refills | Status: AC

## 2020-11-29 MED ORDER — DIPHENHYDRAMINE HCL 50 MG/ML IJ SOLN: 50.0000 mg | Freq: Once | INTRAMUSCULAR | Status: DC | PRN

## 2020-11-29 MED ORDER — ALBUTEROL SULFATE HFA 108 (90 BASE) MCG/ACT IN AERS: 2.0000 | INHALATION_SPRAY | Freq: Once | RESPIRATORY_TRACT | Status: DC | PRN

## 2020-11-29 MED ORDER — FAMOTIDINE IN NACL 20-0.9 MG/50ML-% IV SOLN: 20.0000 mg | Freq: Once | INTRAVENOUS | Status: DC | PRN

## 2020-11-29 MED ORDER — METHYLPREDNISOLONE SODIUM SUCC 125 MG IJ SOLR: 125.0000 mg | Freq: Once | INTRAMUSCULAR | Status: DC | PRN

## 2020-11-29 NOTE — Clinical Social Work Note (Signed)
PT recommends OPPT. Patient referred to OPPT.     Sayre Mazor, Juleen China, LCSW

## 2020-11-29 NOTE — Discharge Summary (Signed)
Physician Discharge Summary [facility]    Patient: [name] [name]                   Admit date: 11/28/2020   DOB: 1970/11/13             Discharge date:11/29/2020/10:34 AM ZOX:[id number]                          PCP: [facility]  Disposition: HOME   Recommendations for Outpatient Follow-up:   Follow up: With cardiologist within 2-3 weeks Follow-up with PCP in 2-3 weeks Any current prescribed medication including Eliquis, Cardizem, metoprolol,  Status post monoclonal antibody treatment for COVID x1 Continue isolation for total 10 days  Discharge Condition: Stable   Code Status:   Code Status: Full Code  Diet recommendation: Cardiac diet   Discharge Diagnoses:    Principal Problem:   Atrial fibrillation with RVR (HCC) Active Problems:   Essential (primary) hypertension   DMII (diabetes mellitus, type 2) (HCC)   HLD (hyperlipidemia)   Morbid (severe) obesity due to excess calories (HCC)   A-fib (HCC)   History of Present Illness/ Hospital Course Charline Bills Summary:   [name] [name] is a 50 y.o. male with medical history significant of   HTN, HLD, DM2, GERD, H/of kidney stones, history of tachycardia, presenting with palpitation. He has had multiple visit to the ED for tachycardia post knee surgery has had work-up including cardiac enzymes, DVT PE evaluation, thyroid, and outpatient cardiology evaluation. Management of minimal exertion, ambulation noted palpitation, his heart rate was as high as 150. EMS was called, patient was found in A. fib, Cardizem was started..  Patient was brought to the ED    Abnormal labs; CBC BMP within normal notes exception of potassium 3.4, glucose 135, Chart review nephro DVT PE, follow-up with cardiology at [facility]   SARS-CoV-2 positive----incidental, mild respiratory symptom, satting 99% on room air  In ED to bolus of 5 mg IV metoprolol was given, started on Cardizem drip,   Atrial fibrillation with RVR (HCC)  -new onset -Was admitted to telemetry floor, was on Cardizem drip, was tapered down,  switched to p.o. Cardizem, and metoprolol  Labs including TSH within normal limits, electrolytes potassium and magnesium was repleted Patient has converted back to normal sinus rhythm now  -2D echocardiogram -ejection fraction 55-60% reviewed by cardiologist reported to be within normal limits, preserved ejection fraction, -Discharge new medication: metoprolol  XL to 100 mg p.o. daily, diltiazem CD1 120 mg p.o. daily, Eliquis 5 mg p.o. twice daily    -CHA2DS2-VASc score >  2, initiating chronic anticoagulation therapy with Eliquis (Pros and cons of anticoagulation discussed with patient detail-expressed understanding)   -CTA from 11/12/2020-reviewed: IMPRESSION: 1. Negative examination for pulmonary embolism. 2. Small nonobstructive left renal calculus.   SARS-CoV-2 positive  -On room air satting 99%, mild upper respiratory symptoms -Treating with monoclonal antibody times 11/29/2020 (Bebtelovimab) -Continue isolation for 10 days   Aactive Problems:    HTN (hypertension)  -Stable, home medication Norvasc discontinued, as metoprolol and Cardizem was added     DMII (diabetes mellitus, type 2) (HCC)  -continue metformin, hemoglobin A1c 7.0     HLD (hyperlipidemia) -Continue home dose statin, LDL at goal 40      Morbid (severe) obesity due to excess calories (HCC) Body mass index is 39.25 kg/m. -Discussed with patient regarding healthy diet exercise and weight loss -Per patient he has been steadily losing  weight intentionally   History of GERD -currently asymptomatic, with holding PPI   Osteoarthritis right knee -Status post total arthroplasty on 7/ 22 -Right knee surgical scar healing well, no signs of infection erythema or edema      Consults called:  Cardiology --------------------------------------------------------------------------------------------------------------------------------------------  Code Status:   Code Status: Full Code  -------------------------------------------------------------------------------------------------------------------------------------------------Disposition Plan:  Status is: Inpatient  Dispo: The patient is from: Home              Anticipated d/c is to: Home   Discharge Instructions:   Discharge Instructions     Activity as tolerated - No restrictions   Complete by: As directed    Call MD for:  difficulty breathing, headache or visual disturbances   Complete by: As directed    Call MD for:  persistant dizziness or light-headedness   Complete by: As directed    Call MD for:  persistant nausea and vomiting   Complete by: As directed    Call MD for:  temperature >100.4   Complete by: As directed    Diet - low sodium heart healthy   Complete by: As directed    Discharge instructions   Complete by: As directed    Follow-up with cardiology within 1-2 weeks, Continue currently recommended medication   Increase activity slowly   Complete by: As directed         Medication List     STOP taking these medications    amLODipine 5 MG tablet Commonly known as: NORVASC   aspirin 81 MG chewable tablet   levofloxacin 750 MG tablet Commonly known as: Levaquin   methocarbamol 500 MG tablet Commonly known as: ROBAXIN       TAKE these medications    acetaminophen 650 MG CR tablet Commonly known as: TYLENOL Take 1,300 mg by mouth every 8 (eight) hours as needed for pain.   apixaban 5 MG Tabs tablet Commonly known as: ELIQUIS Take 1 tablet (5 mg total) by mouth 2 (two) times daily.   atorvastatin 20 MG tablet Commonly known as: LIPITOR Take 20 mg by mouth daily.   diltiazem 120 MG 24 hr capsule Commonly known as: CARDIZEM CD Take 1 capsule (120 mg total) by mouth daily. Start  taking on: November 30, 2020   fluticasone 50 MCG/ACT nasal spray Commonly known as: FLONASE Place 1 spray into both nostrils 2 (two) times daily as needed for allergies or rhinitis.   metFORMIN 500 MG tablet Commonly known as: GLUCOPHAGE Take 500-1,000 mg by mouth in the morning and at bedtime. 1 in am and 2 at bedtime   metoprolol succinate 100 MG 24 hr tablet Commonly known as: TOPROL-XL Take 1 tablet (100 mg total) by mouth daily. Take with or immediately following a meal. Start taking on: November 30, 2020 What changed:  medication strength how much to take when to take this additional instructions   oxyCODONE 5 MG immediate release tablet Commonly known as: Oxy IR/ROXICODONE Take 1-2 tablets (5-10 mg total) by mouth every 6 (six) hours as needed for moderate pain (pain score 4-6).        Allergies  Allergen Reactions   Penicillins Other (See Comments)    Has patient had a PCN reaction causing immediate rash, facial/tongue/throat swelling, SOB or lightheadedness with hypotension: Unknown Has patient had a PCN reaction causing severe rash involving mucus membranes or skin necrosis: Unknown Has patient had a PCN reaction that required hospitalization: Unknown Has patient had a PCN reaction occurring within the last 10  years: No Childhood reaction If all of the above answers are "NO", then may proceed with Cephalosporin use.      Procedures /Studies:   DG Chest 2 View  Result Date: 11/12/2020 CLINICAL DATA:  Chest pain EXAM: CHEST - 2 VIEW COMPARISON:  None. FINDINGS: The heart size and mediastinal contours are within normal limits. Both lungs are clear. The visualized skeletal structures are unremarkable. IMPRESSION: No evidence of acute cardiopulmonary disease. Electronically Signed   By: Caprice RenshawJacob  Kahn   On: 11/12/2020 15:09   CT Angio Chest Pulmonary Embolism (PE) W or WO Contrast  Result Date: 11/12/2020 CLINICAL DATA:  PE suspected EXAM: CT ANGIOGRAPHY CHEST WITH  CONTRAST TECHNIQUE: Multidetector CT imaging of the chest was performed using the standard protocol during bolus administration of intravenous contrast. Multiplanar CT image reconstructions and MIPs were obtained to evaluate the vascular anatomy. CONTRAST:  100mL OMNIPAQUE IOHEXOL 350 MG/ML SOLN COMPARISON:  03/23/2019 FINDINGS: Cardiovascular: Satisfactory opacification of the pulmonary arteries to the segmental level. No evidence of pulmonary embolism. Normal heart size. No pericardial effusion. Mediastinum/Nodes: No enlarged mediastinal, hilar, or axillary lymph nodes. Thyroid gland, trachea, and esophagus demonstrate no significant findings. Lungs/Pleura: Lungs are clear. No pleural effusion or pneumothorax. Upper Abdomen: No acute abnormality. Small nonobstructive left renal calculus. Musculoskeletal: No chest wall abnormality. No acute or significant osseous findings. Review of the MIP images confirms the above findings. IMPRESSION: 1. Negative examination for pulmonary embolism. 2. Small nonobstructive left renal calculus. Electronically Signed   By: Lauralyn PrimesAlex  Bibbey M.D.   On: 11/12/2020 17:54   US Venous Img Lower Unilateral Right  Result Date: 11/12/2020 CLINICAL DATA:  Right calf tenderness and status post right knee arthroplasty on 10/29/2020 EXAM: RIGHT LOWER EXTREMITY VENOUS DOPPLER ULTRASOUND TECHNIQUE: Gray-scale sonography with graded compression, as well as color Doppler and duplex ultrasound were performed to evaluate the lower extremity deep venous systems from the level of the common femoral vein and including the common femoral, femoral, profunda femoral, popliteal and calf veins including the posterior tibial, peroneal and gastrocnemius veins when visible. The superficial great saphenous vein was also interrogated. Spectral Doppler was utilized to evaluate flow at rest and with distal augmentation maneuvers in the common femoral, femoral and popliteal veins. COMPARISON:  None. FINDINGS:  Contralateral Common Femoral Vein: Respiratory phasicity is normal and symmetric with the symptomatic side. No evidence of thrombus. Normal compressibility. Common Femoral Vein: No evidence of thrombus. Normal compressibility, respiratory phasicity and response to augmentation. Saphenofemoral Junction: No evidence of thrombus. Normal compressibility and flow on color Doppler imaging. Profunda Femoral Vein: No evidence of thrombus. Normal compressibility and flow on color Doppler imaging. Femoral Vein: No evidence of thrombus. Normal compressibility, respiratory phasicity and response to augmentation. Popliteal Vein: No evidence of thrombus. Normal compressibility, respiratory phasicity and response to augmentation. Calf Veins: No evidence of thrombus. Normal compressibility and flow on color Doppler imaging. Superficial Great Saphenous Vein: No evidence of thrombus. Normal compressibility. Venous Reflux:  None. Other Findings: No evidence of superficial thrombophlebitis or abnormal fluid collection. IMPRESSION: No evidence of right lower extremity deep venous thrombosis. No focal fluid collections identified. Electronically Signed   By: Irish LackGlenn  Yamagata M.D.   On: 11/12/2020 15:57   DG Chest Port 1 View  Result Date: 11/14/2020 CLINICAL DATA:  Questionable sepsis, tachycardia, fever, recent knee surgery EXAM: PORTABLE CHEST 1 VIEW COMPARISON:  11/12/2020 FINDINGS: The heart size and mediastinal contours are within normal limits. Both lungs are clear. The visualized skeletal structures are unremarkable. IMPRESSION:  No acute abnormality of the lungs in AP portable projection. Electronically Signed   By: Lauralyn Primes M.D.   On: 11/14/2020 13:29   ECHOCARDIOGRAM COMPLETE  Result Date: 11/28/2020    ECHOCARDIOGRAM REPORT   Patient Name:   WORTHINGTON CRUZAN Date of Exam: 11/28/2020 Medical Rec #:  [id number]       Height:       75.0 in Accession #:    [id number]      Weight:       314.0 lb Date of Birth:  10/13/1970         BSA:          2.658 m Patient Age:    50 years        BP:           126/70 mmHg Patient Gender: M               HR:           63 bpm. [facility] Procedure: 2D Echo, Cardiac Doppler and Color Doppler Indications:    Atrial Fibrillation  History:        Patient has no prior history of Echocardiogram examinations.                 Arrythmias:Atrial Fibrillation; Risk Factors:Hypertension and                 Diabetes. COVID +.  Sonographer:    Mikki Harbor Referring Phys: [phone] Jhoel Stieg A Yamel Bale IMPRESSIONS  1. Left ventricular ejection fraction, by estimation, is 55 to 60%. The left ventricle has normal function. The left ventricle has no regional wall motion abnormalities. There is moderate left ventricular hypertrophy. Left ventricular diastolic parameters were normal.  2. Right ventricular systolic function is normal. The right ventricular size is normal. There is normal pulmonary artery systolic pressure. The estimated right ventricular systolic pressure is 22.7 mmHg.  3. The mitral valve is grossly normal. Mild mitral valve regurgitation.  4. The aortic valve is tricuspid. Aortic valve regurgitation is not visualized. No aortic stenosis is present. Aortic valve mean gradient measures 3.0 mmHg. Comparison(s): No prior Echocardiogram. FINDINGS  Left Ventricle: Left ventricular ejection fraction, by estimation, is 55 to 60%. The left ventricle has normal function. The left ventricle has no regional wall motion abnormalities. The left ventricular internal cavity size was normal in size. There is  moderate left ventricular hypertrophy. Left ventricular diastolic parameters were normal. Right Ventricle: The right ventricular size is normal. No increase in right ventricular wall thickness. Right ventricular systolic function is normal. There is normal pulmonary artery systolic pressure. The tricuspid regurgitant velocity is 2.22 m/s, and  with an assumed right atrial pressure of 3 mmHg, the  estimated right ventricular systolic pressure is 22.7 mmHg. Left Atrium: Left atrial size was normal in size. Right Atrium: Right atrial size was normal in size. Pericardium: There is no evidence of pericardial effusion. Mitral Valve: The mitral valve is grossly normal. Mild mitral valve regurgitation. MV peak gradient, 3.0 mmHg. The mean mitral valve gradient is 1.0 mmHg. Tricuspid Valve: The tricuspid valve is grossly normal. Tricuspid valve regurgitation is mild. Aortic Valve: The aortic valve is tricuspid. Aortic valve regurgitation is not visualized. No aortic stenosis is present. Aortic valve mean gradient measures 3.0 mmHg. Aortic valve peak gradient measures 5.3 mmHg. Aortic valve area, by VTI measures 3.05 cm. Pulmonic Valve: The pulmonic valve was grossly normal. Pulmonic valve regurgitation is trivial. Aorta: The aortic  root is normal in size and structure. IAS/Shunts: No atrial level shunt detected by color flow Doppler.  LEFT VENTRICLE PLAX 2D LVIDd:         4.22 cm  Diastology LVIDs:         3.16 cm  LV e' medial:    9.11 cm/s LV PW:         1.40 cm  LV E/e' medial:  8.5 LV IVS:        1.54 cm  LV e' lateral:   10.70 cm/s LVOT diam:     2.10 cm  LV E/e' lateral: 7.2 LV SV:         66 LV SV Index:   25 LVOT Area:     3.46 cm  RIGHT VENTRICLE RV Basal diam:  3.85 cm RV Mid diam:    3.20 cm RV S prime:     17.30 cm/s TAPSE (M-mode): 2.9 cm LEFT ATRIUM             Index       RIGHT ATRIUM           Index LA diam:        3.70 cm 1.39 cm/m  RA Area:     17.70 cm LA Vol (A2C):   71.0 ml 26.71 ml/m RA Volume:   48.80 ml  18.36 ml/m LA Vol (A4C):   68.7 ml 25.85 ml/m LA Biplane Vol: 69.7 ml 26.22 ml/m  AORTIC VALVE AV Area (Vmax):    2.69 cm AV Area (Vmean):   2.85 cm AV Area (VTI):     3.05 cm AV Vmax:           115.00 cm/s AV Vmean:          73.900 cm/s AV VTI:            0.216 m AV Peak Grad:      5.3 mmHg AV Mean Grad:      3.0 mmHg LVOT Vmax:         89.30 cm/s LVOT Vmean:        60.800 cm/s  LVOT VTI:          0.190 m LVOT/AV VTI ratio: 0.88  AORTA Ao Root diam: 3.50 cm Ao Asc diam:  3.50 cm MITRAL VALVE               TRICUSPID VALVE MV Area (PHT): 1.51 cm    TR Peak grad:   19.7 mmHg MV Area VTI:   2.31 cm    TR Vmax:        222.00 cm/s MV Peak grad:  3.0 mmHg MV Mean grad:  1.0 mmHg    SHUNTS MV Vmax:       0.86 m/s    Systemic VTI:  0.19 m MV Vmean:      45.1 cm/s   Systemic Diam: 2.10 cm MV Decel Time: 501 msec MV E velocity: 77.15 cm/s MV A velocity: 58.30 cm/s MV E/A ratio:  1.32 Nona Dell MD Electronically signed by Nona Dell MD Signature Date/Time: 11/28/2020/11:40:53 AM    Final     Subjective:   Patient was seen and examined 11/29/2020, 10:34 AM Patient stable today. No acute distress.  No issues overnight Stable for discharge.  Discharge Exam:    Vitals:   11/29/20 0415 11/29/20 0500 11/29/20 0600 11/29/20 0820  BP: 131/80 128/78 126/76 122/61  Pulse: 77 76 68 71  Resp: 20 (!) Temp:  98 F (36.7 C)    TempSrc:  Oral    SpO2: 100% 97% 97% 100%  Weight:      Height:        General: Pt lying comfortably in bed & appears in no obvious distress. Cardiovascular: S1 & S2 heard, RRR, S1/S2 +. No murmurs, rubs, gallops or clicks. No JVD or pedal edema. Respiratory: Clear to auscultation without wheezing, rhonchi or crackles. No increased work of breathing. Abdominal:  Non-distended, non-tender & soft. No organomegaly or masses appreciated. Normal bowel sounds heard. CNS: Alert and oriented. No focal deficits. Extremities: no edema, no cyanosis      The results of significant diagnostics from this hospitalization (including imaging, microbiology, ancillary and laboratory) are listed below for reference.      Microbiology:   Recent Results (from the past 240 hour(s))  Resp Panel by RT-PCR (Flu A&B, Covid) Nasopharyngeal Swab     Status: Abnormal   Collection Time: 11/28/20  5:30 AM   Specimen: Nasopharyngeal Swab; Nasopharyngeal(NP) swabs  in vial transport medium  Result Value Ref Range Status   SARS Coronavirus 2 by RT PCR POSITIVE (A) NEGATIVE Final    Comment: RESULT CALLED TO, READ BACK BY AND VERIFIED WITH: ZIFER M @ 0930 ON [id number] BY HENDERSON L. (NOTE) SARS-CoV-2 target nucleic acids are DETECTED.  The SARS-CoV-2 RNA is generally detectable in upper respiratory specimens during the acute phase of infection. Positive results are indicative of the presence of the identified virus, but do not rule out bacterial infection or co-infection with other pathogens not detected by the test. Clinical correlation with patient history and other diagnostic information is necessary to determine patient infection status. The expected result is Negative.  Fact Sheet for Patients: BloggerCourse.com  Fact Sheet for Healthcare Providers: SeriousBroker.it  This test is not yet approved or cleared by the [place] FDA and  has been authorized for detection and/or diagnosis of SARS-CoV-2 by FDA under an Emergency Use Authorization (EUA).  This EUA will remain in effect (meaning this test  can be used) for the duration of  the COVID-19 declaration under Section 564(b)(1) of the Act, 21 U.S.C. section 360bbb-3(b)(1), unless the authorization is terminated or revoked sooner.     Influenza A by PCR NEGATIVE NEGATIVE Final   Influenza B by PCR NEGATIVE NEGATIVE Final    Comment: (NOTE) The Xpert Xpress SARS-CoV-2/FLU/RSV plus assay is intended as an aid in the diagnosis of influenza from Nasopharyngeal swab specimens and should not be used as a sole basis for treatment. Nasal washings and aspirates are unacceptable for Xpert Xpress SARS-CoV-2/FLU/RSV testing.  Fact Sheet for Patients: BloggerCourse.com  Fact Sheet for Healthcare Providers: SeriousBroker.it  This test is not yet approved or cleared by the [place] FDA  and has been authorized for detection and/or diagnosis of SARS-CoV-2 by FDA under an Emergency Use Authorization (EUA). This EUA will remain in effect (meaning this test can be used) for the duration of the COVID-19 declaration under Section 564(b)(1) of the Act, 21 U.S.C. section 360bbb-3(b)(1), unless the authorization is terminated or revoked.  Performed at [facility], [address], [place], [place] [address]      Labs:   CBC: Recent Labs  Lab 11/28/20 0507 11/29/20 0615  WBC 5.2 5.5  HGB 12.4* 12.3*  HCT 37.2* 38.0*  MCV 88.2 89.6  PLT 236 232   Basic Metabolic Panel: Recent Labs  Lab 11/28/20 0507 11/29/20 0615  NA 137 139  K 3.4* 4.2  CL  106 105  CO2 21* 26  GLUCOSE 155* 162*  BUN 11 7  CREATININE 0.87 0.90  CALCIUM 8.0* 8.7*  MG 1.8  --    Liver Function Tests: No results for input(s): AST, ALT, ALKPHOS, BILITOT, PROT, ALBUMIN in the last 168 hours. BNP (last 3 results) Recent Labs    11/28/20 0829  BNP 64.0   Cardiac Enzymes: No results for input(s): CKTOTAL, CKMB, CKMBINDEX, TROPONINI in the last 168 hours. CBG: Recent Labs  Lab 11/28/20 0827 11/28/20 1210 11/28/20 1837 11/29/20 0817  GLUCAP 148* 121* 177* 130*   Hgb A1c Recent Labs    11/28/20 0530  HGBA1C 7.0*   Lipid Profile Recent Labs    11/28/20 0530  CHOL 75  HDL 20*  LDLCALC 40  TRIG 76  CHOLHDL 3.8   Thyroid function studies Recent Labs    11/28/20 0530  TSH 1.025   Anemia work up No results for input(s): VITAMINB12, FOLATE, FERRITIN, TIBC, IRON, RETICCTPCT in the last 72 hours. Urinalysis    Component Value Date/Time   COLORURINE YELLOW 11/14/2020 1408   APPEARANCEUR CLEAR 11/14/2020 1408   LABSPEC 1.014 11/14/2020 1408   PHURINE 6.0 11/14/2020 1408   GLUCOSEU 150 (A) 11/14/2020 1408   HGBUR SMALL (A) 11/14/2020 1408   BILIRUBINUR NEGATIVE 11/14/2020 1408   KETONESUR NEGATIVE 11/14/2020 1408   PROTEINUR NEGATIVE 11/14/2020 1408   NITRITE NEGATIVE  11/14/2020 1408   LEUKOCYTESUR NEGATIVE 11/14/2020 1408         Time coordinating discharge: Over 45 minutes  SIGNED: Kendell Bane, MD, FACP, FHM. [facility],  Please use [email or web address] to Page If 7PM-7AM, please contact night-coverage [email or web address] 11/29/2020, 10:34 AM

## 2020-11-29 NOTE — Consult Note (Addendum)
Cardiology Consultation:   Patient ID: VERNIE PIET MRN: [id number]; DOB: [birth year]  Admit date: 11/28/2020 Date of Consult: 11/29/2020  PCP:  [facility]   [facility] Providers Cardiologist:  Diona Browner    Patient Profile:   [name] [name] is a 50 y.o. male with a hx of PAT, HTN, DM2, morbid obesity who is being seen 11/29/2020 for the evaluation of Atrial fibrillations at the request of Dr. Flossie Dibble.  History of Present Illness:   [name] [name] was seen by [place] in 2019 for PAT. He had a TKA 10/2020 and has had multiple ER visits for tachycardia since then. He was felt to be dehydrated and febrile 8/5 & 11/14/20-DVT and PE were ruled out, BS mid 300's. He saw and primary cardiologist Dr. Hubert Azure, DO with [facility] in [place] 11/16/20 and patient requested admission because he felt bad-went to ER and sent home.echo per notes in care everywhere show normal systolic function and no valve abnormalities. NST 06/2019 normal.  Initial EKG here showed junctional tachycardia but f/u showed Afib. Now in NSR. Patient says a referral was put in for him to switch to Dr. Diona Browner in [place] last Thurs. Feeling better now in NSR. Denies ETOH or smoking history. Compliant with meds but was only on metoprolol 12.5 mg bid PTA. Sat had some chest pain with tachycardia but usually doesn't.    Past Medical History:  Diagnosis Date   Arthritis    Diabetes mellitus without complication (HCC)    GERD (gastroesophageal reflux disease)    Gout    History of kidney stones    Hypertension    Pneumonia    several years ago    Past Surgical History:  Procedure Laterality Date   CYSTOSCOPY/RETROGRADE/URETEROSCOPY/STONE EXTRACTION WITH BASKET Bilateral 08/29/2016   Procedure: CYSTOSCOPY/BILATERAL RETEROGRADE BILATERTAL URETEROSCOPY/STONE EXTRACTION WITH BASKET, RIGHT STENT PLACEMENT, HOLIUM LASER;  Surgeon: Bjorn Pippin, MD;  Location: [facility];  Service: Urology;  Laterality: Bilateral;   KNEE  SURGERY     x 4   STONE EXTRACTION WITH BASKET     STRABISMUS SURGERY Right 04/02/2015   Procedure: REPAIR STRABISMUS RIGHT EYE;  Surgeon: Verne Carrow, MD;  Location: [facility];  Service: Ophthalmology;  Laterality: Right;   TOTAL KNEE ARTHROPLASTY Right 10/29/2020   Procedure: RIGHT TOTAL KNEE ARTHROPLASTY;  Surgeon: Kathryne Hitch, MD;  Location: [facility];  Service: Orthopedics;  Laterality: Right;     Home Medications:  Prior to Admission medications   Medication Sig Start Date End Date Taking? Authorizing Provider  acetaminophen (TYLENOL) 650 MG CR tablet Take 1,300 mg by mouth every 8 (eight) hours as needed for pain.   Yes [provider]  amLODipine (NORVASC) 5 MG tablet Take 5 mg by mouth daily.   Yes [provider]  aspirin 81 MG chewable tablet Chew 1 tablet (81 mg total) by mouth 2 (two) times daily. Patient taking differently: Chew 81 mg by mouth daily. 10/30/20  Yes Kathryne Hitch, MD  atorvastatin (LIPITOR) 20 MG tablet Take 20 mg by mouth daily.   Yes [provider]  fluticasone (FLONASE) 50 MCG/ACT nasal spray Place 1 spray into both nostrils 2 (two) times daily as needed for allergies or rhinitis.   Yes [provider]  metFORMIN (GLUCOPHAGE) 500 MG tablet Take 500-1,000 mg by mouth in the morning and at bedtime. 1 in am and 2 at bedtime   Yes [provider]  metoprolol succinate (TOPROL-XL) 25 MG 24 hr tablet  Take 12.5 mg by mouth in the morning and at bedtime.   Yes [provider]  oxyCODONE (OXY IR/ROXICODONE) 5 MG immediate release tablet Take 1-2 tablets (5-10 mg total) by mouth every 6 (six) hours as needed for moderate pain (pain score 4-6). 11/18/20  Yes Kathryne Hitch, MD  levofloxacin (LEVAQUIN) 750 MG tablet Take 1 tablet (750 mg total) by mouth daily. Patient not taking: No sig reported 11/14/20   Eber Hong, MD  methocarbamol (ROBAXIN) 500 MG tablet Take 1 tablet  (500 mg total) by mouth every 6 (six) hours as needed for muscle spasms. Patient not taking: No sig reported 10/30/20   Kathryne Hitch, MD    Inpatient Medications: Scheduled Meds:  apixaban  5 mg Oral BID   aspirin EC  81 mg Oral Daily   atorvastatin  20 mg Oral Daily   diltiazem  120 mg Oral Daily   insulin aspart  0-9 Units Subcutaneous TID WC   metFORMIN  1,000 mg Oral Q breakfast   metoprolol succinate  100 mg Oral Daily   sodium chloride flush  3 mL Intravenous Q12H   sodium chloride flush  3 mL Intravenous Q12H   Continuous Infusions:  sodium chloride     PRN Meds: sodium chloride, acetaminophen **OR** acetaminophen, bisacodyl, hydrALAZINE, HYDROmorphone (DILAUDID) injection, metoprolol tartrate, ondansetron **OR** ondansetron (ZOFRAN) IV, oxyCODONE, senna-docusate, sodium chloride flush  Allergies:    Allergies  Allergen Reactions   Penicillins Other (See Comments)    Has patient had a PCN reaction causing immediate rash, facial/tongue/throat swelling, SOB or lightheadedness with hypotension: Unknown Has patient had a PCN reaction causing severe rash involving mucus membranes or skin necrosis: Unknown Has patient had a PCN reaction that required hospitalization: Unknown Has patient had a PCN reaction occurring within the last 10 years: No Childhood reaction If all of the above answers are "NO", then may proceed with Cephalosporin use.     Social History:   Social History   Socioeconomic History   Marital status: Married    Spouse name: Not on file   Number of children: Not on file   Years of education: Not on file   Highest education level: Not on file  Occupational History   Not on file  Tobacco Use   Smoking status: Never   Smokeless tobacco: Never  Vaping Use   Vaping Use: Never used  Substance and Sexual Activity   Alcohol use: No   Drug use: No   Sexual activity: Not on file  Other Topics Concern   Not on file  Social History Narrative    Not on file   Social Determinants of Health   Financial Resource Strain: Not on file  Food Insecurity: Not on file  Transportation Needs: Not on file  Physical Activity: Not on file  Stress: Not on file  Social Connections: Not on file  Intimate Partner Violence: Not on file    Family History:    History reviewed. No pertinent family history.   ROS:  Please see the history of present illness.  Review of Systems  Constitutional: Negative.  HENT: Negative.    Cardiovascular:  Positive for dyspnea on exertion, irregular heartbeat and palpitations.  Respiratory: Negative.    Endocrine: Negative.   Hematologic/Lymphatic: Negative.   Musculoskeletal:  Positive for joint swelling.  Gastrointestinal: Negative.   Genitourinary: Negative.   Neurological: Negative.    All other ROS reviewed and negative.     Physical Exam/Data:   Vitals:  11/29/20 0200 11/29/20 0415 11/29/20 0500 11/29/20 0600  BP: 118/67 131/80 128/78 126/76  Pulse: 62 77 76 68  Resp: 19 20 (!) 24 18  Temp:   98 F (36.7 C)   TempSrc:   Oral   SpO2: 98% 100% 97% 97%  Weight:      Height:        Intake/Output Summary (Last 24 hours) at 11/29/2020 0756 Last data filed at 11/28/2020 2354 Gross per 24 hour  Intake 1572.98 ml  Output --  Net 1572.98 ml   Last 3 Weights 11/28/2020 11/14/2020 10/29/2020  Weight (lbs) 314 lb 320 lb 335 lb 15.7 oz  Weight (kg) 142.429 kg 145.151 kg 152.4 kg     Body mass index is 39.25 kg/m.  General:  Obese, in no acute distress  HEENT: normal Lymph: no adenopathy Neck: no JVD Endocrine:  No thryomegaly Vascular: No carotid bruits; FA pulses 2+ bilaterally without bruits  Cardiac:  normal S1, S2; RRR; no murmur   Lungs:  clear to auscultation bilaterally, no wheezing, rhonchi or rales  Abd: soft, nontender, no hepatomegaly  Ext: right knee swelling and leg bruising from TKR Musculoskeletal:  No deformities, BUE and BLE strength normal and equal Skin: warm and dry   Neuro:  CNs 2-12 intact, no focal abnormalities noted Psych:  Normal affect   EKG:  The EKG was personally reviewed and demonstrates:  initial EKG shows junctional tachycardia but f/u Afib with RVR and now NSR with inflat Q waves Telemetry:  Telemetry was personally reviewed and demonstrates:  Afib converted to NSR. Strips in ER room  Relevant CV Studies:   Stress test: 06/2019 Exam: PET MYOCARDIAL PERFUSION MULTIPLE (Rest/Stress Rb - Regadenoson) Impressions: - Equivocal probably normal / low risk myocardial perfusion study (extreme motion in scanner) - There is a small in size, mild in severity, fixed defect involving the apical inferior segment. This is consistent with probable artifact (motion artifact, misregistration artifact and attenuation). - During stress: Global systolic function is normal. The ejection fraction was greater than 65%. - No significant coronary calcifications were noted on the attenuation CT - Sensitivity and specificity of this test are reduced by the noted motion and BMI  Echo: 03/24/2019 Summary 1. Technically difficult study due to chest wall/lung interference and body habitus. 2. Echo contrast utilized to enhance endocardial border definition. 3. The left ventricle is normal in size with mildly to moderately increased wall thickness. 4. Normal left ventricular systolic function, ejection fraction 65-70%. 5. Normal right ventricular size and systolic function. 6. Dilated left atrium - mildly dilated. Laboratory Data:  High Sensitivity Troponin:   Recent Labs  Lab 11/12/20 1448 11/12/20 1610  TROPONINIHS 5 6     Chemistry Recent Labs  Lab 11/28/20 0507 11/29/20 0615  NA 137 139  K 3.4* 4.2  CL 106 105  CO2 21* 26  GLUCOSE 155* 162*  BUN 11 7  CREATININE 0.87 0.90  CALCIUM 8.0* 8.7*  GFRNONAA >60 >60  ANIONGAP 10 8    No results for input(s): PROT, ALBUMIN, AST, ALT, ALKPHOS, BILITOT in the last 168 hours. Hematology Recent Labs  Lab  11/28/20 0507 11/29/20 0615  WBC 5.2 5.5  RBC 4.22 4.24  HGB 12.4* 12.3*  HCT 37.2* 38.0*  MCV 88.2 89.6  MCH 29.4 29.0  MCHC 33.3 32.4  RDW 12.5 12.6  PLT 236 232   BNP Recent Labs  Lab 11/28/20 0829  BNP 64.0    DDimer No results for input(s): DDIMER  in the last 168 hours.   Radiology/Studies:  ECHOCARDIOGRAM COMPLETE  Result Date: 11/28/2020    ECHOCARDIOGRAM REPORT   Patient Name:   SIMS LADAY Date of Exam: 11/28/2020 Medical Rec #:  [id number]       Height:       75.0 in Accession #:    [id number]      Weight:       314.0 lb Date of Birth:  02-17-1971        BSA:          2.658 m Patient Age:    50 years        BP:           126/70 mmHg Patient Gender: M               HR:           63 bpm. [facility] Procedure: 2D Echo, Cardiac Doppler and Color Doppler Indications:    Atrial Fibrillation  History:        Patient has no prior history of Echocardiogram examinations.                 Arrythmias:Atrial Fibrillation; Risk Factors:Hypertension and                 Diabetes. COVID +.  Sonographer:    Mikki Harbor Referring Phys: [phone] SEYED A SHAHMEHDI IMPRESSIONS  1. Left ventricular ejection fraction, by estimation, is 55 to 60%. The left ventricle has normal function. The left ventricle has no regional wall motion abnormalities. There is moderate left ventricular hypertrophy. Left ventricular diastolic parameters were normal.  2. Right ventricular systolic function is normal. The right ventricular size is normal. There is normal pulmonary artery systolic pressure. The estimated right ventricular systolic pressure is 22.7 mmHg.  3. The mitral valve is grossly normal. Mild mitral valve regurgitation.  4. The aortic valve is tricuspid. Aortic valve regurgitation is not visualized. No aortic stenosis is present. Aortic valve mean gradient measures 3.0 mmHg. Comparison(s): No prior Echocardiogram. FINDINGS  Left Ventricle: Left ventricular ejection fraction, by estimation,  is 55 to 60%. The left ventricle has normal function. The left ventricle has no regional wall motion abnormalities. The left ventricular internal cavity size was normal in size. There is  moderate left ventricular hypertrophy. Left ventricular diastolic parameters were normal. Right Ventricle: The right ventricular size is normal. No increase in right ventricular wall thickness. Right ventricular systolic function is normal. There is normal pulmonary artery systolic pressure. The tricuspid regurgitant velocity is 2.22 m/s, and  with an assumed right atrial pressure of 3 mmHg, the estimated right ventricular systolic pressure is 22.7 mmHg. Left Atrium: Left atrial size was normal in size. Right Atrium: Right atrial size was normal in size. Pericardium: There is no evidence of pericardial effusion. Mitral Valve: The mitral valve is grossly normal. Mild mitral valve regurgitation. MV peak gradient, 3.0 mmHg. The mean mitral valve gradient is 1.0 mmHg. Tricuspid Valve: The tricuspid valve is grossly normal. Tricuspid valve regurgitation is mild. Aortic Valve: The aortic valve is tricuspid. Aortic valve regurgitation is not visualized. No aortic stenosis is present. Aortic valve mean gradient measures 3.0 mmHg. Aortic valve peak gradient measures 5.3 mmHg. Aortic valve area, by VTI measures 3.05 cm. Pulmonic Valve: The pulmonic valve was grossly normal. Pulmonic valve regurgitation is trivial. Aorta: The aortic root is normal in size and structure. IAS/Shunts: No atrial level shunt detected by color flow Doppler.  LEFT VENTRICLE PLAX 2D LVIDd:         4.22 cm  Diastology LVIDs:         3.16 cm  LV e' medial:    9.11 cm/s LV PW:         1.40 cm  LV E/e' medial:  8.5 LV IVS:        1.54 cm  LV e' lateral:   10.70 cm/s LVOT diam:     2.10 cm  LV E/e' lateral: 7.2 LV SV:         66 LV SV Index:   25 LVOT Area:     3.46 cm  RIGHT VENTRICLE RV Basal diam:  3.85 cm RV Mid diam:    3.20 cm RV S prime:     17.30 cm/s TAPSE  (M-mode): 2.9 cm LEFT ATRIUM             Index       RIGHT ATRIUM           Index LA diam:        3.70 cm 1.39 cm/m  RA Area:     17.70 cm LA Vol (A2C):   71.0 ml 26.71 ml/m RA Volume:   48.80 ml  18.36 ml/m LA Vol (A4C):   68.7 ml 25.85 ml/m LA Biplane Vol: 69.7 ml 26.22 ml/m  AORTIC VALVE AV Area (Vmax):    2.69 cm AV Area (Vmean):   2.85 cm AV Area (VTI):     3.05 cm AV Vmax:           115.00 cm/s AV Vmean:          73.900 cm/s AV VTI:            0.216 m AV Peak Grad:      5.3 mmHg AV Mean Grad:      3.0 mmHg LVOT Vmax:         89.30 cm/s LVOT Vmean:        60.800 cm/s LVOT VTI:          0.190 m LVOT/AV VTI ratio: 0.88  AORTA Ao Root diam: 3.50 cm Ao Asc diam:  3.50 cm MITRAL VALVE               TRICUSPID VALVE MV Area (PHT): 1.51 cm    TR Peak grad:   19.7 mmHg MV Area VTI:   2.31 cm    TR Vmax:        222.00 cm/s MV Peak grad:  3.0 mmHg MV Mean grad:  1.0 mmHg    SHUNTS MV Vmax:       0.86 m/s    Systemic VTI:  0.19 m MV Vmean:      45.1 cm/s   Systemic Diam: 2.10 cm MV Decel Time: 501 msec MV E velocity: 77.15 cm/s MV A velocity: 58.30 cm/s MV E/A ratio:  1.32 Nona Dell MD Electronically signed by Nona Dell MD Signature Date/Time: 11/28/2020/11:40:53 AM    Final      Assessment and Plan:   Atrial fibrillation with RVR now in NSR CHADVASc=2 so will need to be on Eliquis 5 mg bid, Toprol increased to 100 mg daily and also on diltiazem 120 mg daily. Agree with these meds and would refer to Afib clinic for possible ablation or antiarrhythmics. Echo yesterday over normal LVEF and valves. LA size normal.  History of PAT  HTN controlled  DM2-recently uncontrolled  Morbid obesity-weight loss recommended.   Risk Assessment/Risk  Scores:          CHA2DS2-VASc Score = 2  This indicates a 2.2% annual risk of stroke. The patient's score is based upon: CHF History: No HTN History: Yes Diabetes History: Yes Stroke History: No Vascular Disease History: No Age Score: 0 Gender  Score: 0    [facility] will sign off.   Medication Recommendations:   current treatment Other recommendations (labs, testing, etc):    Follow up as an outpatient:  [place], he also has a referral in for Dr. Diona BrownerMcDowell in [place].  For questions or updates, please contact [facility] Please consult [email or web address] for contact info under    Signed, Jacolyn ReedyMichele Lenze, PA-C  11/29/2020 7:56 AM  Patient examined chart reviewed Exam with recent right TKR Telemetry with NSR normal heart sounds overweight lungs clear no murmur Has converted continue eliquis Toprol and cardizem TTE low risk normal EF. Needs to lose weight once rehab for knee done. Ok to d/c home outpatient [facility] Diona BrownerMcD[facility] and afib clinic   Charlton HawsPeter Suprena Travaglini MD [facility]

## 2020-12-09 ENCOUNTER — Encounter: Payer: BC Managed Care – PPO | Admitting: Physician Assistant

## 2020-12-15 ENCOUNTER — Ambulatory Visit (HOSPITAL_COMMUNITY)
Admit: 2020-12-15 | Discharge: 2020-12-15 | Disposition: A | Discharge status: Home / Self Care | Payer: BC Managed Care – PPO | Source: Ambulatory Visit | Attending: Nurse Practitioner | Admitting: Nurse Practitioner

## 2020-12-15 ENCOUNTER — Encounter (HOSPITAL_COMMUNITY): Payer: Self-pay | Admitting: Nurse Practitioner

## 2020-12-15 ENCOUNTER — Other Ambulatory Visit: Payer: Self-pay

## 2020-12-15 VITALS — BP 130/82 | HR 76 | Ht 75.0 in | Wt 307.2 lb

## 2020-12-15 DIAGNOSIS — E119 Type 2 diabetes mellitus without complications: Secondary | ICD-10-CM | POA: Insufficient documentation

## 2020-12-15 DIAGNOSIS — Z7901 Long term (current) use of anticoagulants: Secondary | ICD-10-CM | POA: Insufficient documentation

## 2020-12-15 DIAGNOSIS — R0683 Snoring: Secondary | ICD-10-CM | POA: Diagnosis not present

## 2020-12-15 DIAGNOSIS — Z79899 Other long term (current) drug therapy: Secondary | ICD-10-CM | POA: Diagnosis not present

## 2020-12-15 DIAGNOSIS — E785 Hyperlipidemia, unspecified: Secondary | ICD-10-CM | POA: Insufficient documentation

## 2020-12-15 DIAGNOSIS — D6869 Other thrombophilia: Secondary | ICD-10-CM | POA: Diagnosis not present

## 2020-12-15 DIAGNOSIS — I4891 Unspecified atrial fibrillation: Secondary | ICD-10-CM | POA: Diagnosis present

## 2020-12-15 DIAGNOSIS — Z7984 Long term (current) use of oral hypoglycemic drugs: Secondary | ICD-10-CM | POA: Insufficient documentation

## 2020-12-15 DIAGNOSIS — I10 Essential (primary) hypertension: Secondary | ICD-10-CM | POA: Diagnosis not present

## 2020-12-15 MED ORDER — DILTIAZEM HCL 30 MG PO TABS: ORAL_TABLET | ORAL | 1 refills | Status: DC

## 2020-12-15 NOTE — Progress Notes (Addendum)
Primary Care Physician: [facility] Referring Physician: [facility] F/u    [name] [name] is a 50 y.o. male with a h/o   HTN, HLD, DM2, GERD, kidney stones, history of tachycardia, presenting with palpitations to the ER 11/29/20.  He has had multiple visits to the ED for tachycardia, post knee surgery  in July, has had work-up including cardiac enzymes, DVT /PE evaluation, thyroid, and outpatient cardiology evaluation. Minimal exertion on ambulation with palpitation, heart rate was as high as 150. Prompted EMS call. Patient was found in A. fib, Cardizem was started..  Patient was brought to the ED.  Pt is now in the afib clinic for f/u ER visit. He was placed on metoprolol and cardizem and is in SR today. He is on eliquis 5 mg bid for a CHA2DS2VASc  score of 2. He will often feel palpitations at night. He admits to snoring. Will order sleep study. He is limited in mobility for his recent knee surgery but is trying to lose weight. No alcohol or tobacco use.    Today, he denies symptoms of palpitations, chest pain, shortness of breath, orthopnea, PND, lower extremity edema, dizziness, presyncope, syncope, or neurologic sequela. The patient is tolerating medications without difficulties and is otherwise without complaint today.   Past Medical History:  Diagnosis Date   Arthritis    Diabetes mellitus without complication (HCC)    GERD (gastroesophageal reflux disease)    Gout    History of kidney stones    Hypertension    Pneumonia    several years ago   Past Surgical History:  Procedure Laterality Date   CYSTOSCOPY/RETROGRADE/URETEROSCOPY/STONE EXTRACTION WITH BASKET Bilateral 08/29/2016   Procedure: CYSTOSCOPY/BILATERAL RETEROGRADE BILATERTAL URETEROSCOPY/STONE EXTRACTION WITH BASKET, RIGHT STENT PLACEMENT, HOLIUM LASER;  Surgeon: Bjorn Pippin, MD;  [facility]: [facility];  Service: Urology;  Laterality: Bilateral;   KNEE SURGERY     x 4   STONE EXTRACTION WITH BASKET     STRABISMUS  SURGERY Right 04/02/2015   Procedure: REPAIR STRABISMUS RIGHT EYE;  Surgeon: Verne Carrow, MD;  Location: [facility];  Service: Ophthalmology;  Laterality: Right;   TOTAL KNEE ARTHROPLASTY Right 10/29/2020   Procedure: RIGHT TOTAL KNEE ARTHROPLASTY;  Surgeon: Kathryne Hitch, MD;  Location: [facility];  Service: Orthopedics;  Laterality: Right;    Current Outpatient Medications  Medication Sig Dispense Refill   acetaminophen (TYLENOL) 650 MG CR tablet Take 1,300 mg by mouth every 8 (eight) hours as needed for pain.     apixaban (ELIQUIS) 5 MG TABS tablet Take 1 tablet (5 mg total) by mouth 2 (two) times daily. 60 tablet 3   atorvastatin (LIPITOR) 20 MG tablet Take 20 mg by mouth daily.     diltiazem (CARDIZEM CD) 120 MG 24 hr capsule Take 1 capsule (120 mg total) by mouth daily. 30 capsule 2   fluticasone (FLONASE) 50 MCG/ACT nasal spray Place 1 spray into both nostrils 2 (two) times daily as needed for allergies or rhinitis.     metFORMIN (GLUCOPHAGE) 500 MG tablet Take 500-1,000 mg by mouth in the morning and at bedtime. 1 in am and 2 at bedtime     metoprolol succinate (TOPROL-XL) 100 MG 24 hr tablet Take 1 tablet (100 mg total) by mouth daily. Take with or immediately following a meal. 30 tablet 2   oxyCODONE (OXY IR/ROXICODONE) 5 MG immediate release tablet Take 1-2 tablets (5-10 mg total) by mouth every 6 (six) hours as needed for moderate pain (pain score  4-6). (Patient not taking: Reported on 12/15/2020) 30 tablet 0   No current facility-administered medications for this encounter.    Allergies  Allergen Reactions   Penicillins Other (See Comments)    Has patient had a PCN reaction causing immediate rash, facial/tongue/throat swelling, SOB or lightheadedness with hypotension: Unknown Has patient had a PCN reaction causing severe rash involving mucus membranes or skin necrosis: Unknown Has patient had a PCN reaction that required hospitalization: Unknown Has patient  had a PCN reaction occurring within the last 10 years: No Childhood reaction If all of the above answers are "NO", then may proceed with Cephalosporin use.     Social History   Socioeconomic History   Marital status: Married    Spouse name: Not on file   Number of children: Not on file   Years of education: Not on file   Highest education level: Not on file  Occupational History   Not on file  Tobacco Use   Smoking status: Never   Smokeless tobacco: Never  Vaping Use   Vaping Use: Never used  Substance and Sexual Activity   Alcohol use: No   Drug use: No   Sexual activity: Not on file  Other Topics Concern   Not on file  Social History Narrative   Not on file   Social Determinants of Health   Financial Resource Strain: Not on file  Food Insecurity: Not on file  Transportation Needs: Not on file  Physical Activity: Not on file  Stress: Not on file  Social Connections: Not on file  Intimate Partner Violence: Not on file    No family history on file.  ROS- All systems are reviewed and negative except as per the HPI above  Physical Exam: Vitals:   12/15/20 1049  Weight: (!) 139.3 kg  Height: 6\' 3"  (1.905 m)   Wt Readings from Last 3 Encounters:  12/15/20 (!) 139.3 kg  11/28/20 (!) 142.4 kg  11/14/20 (!) 145.2 kg    Labs: Lab Results  Component Value Date   NA 139 11/29/2020   K 4.2 11/29/2020   CL 105 11/29/2020   CO2 26 11/29/2020   GLUCOSE 162 (H) 11/29/2020   BUN 7 11/29/2020   CREATININE 0.90 11/29/2020   CALCIUM 8.7 (L) 11/29/2020   MG 1.8 11/28/2020   Lab Results  Component Value Date   INR 1.1 11/29/2020   Lab Results  Component Value Date   CHOL 75 11/28/2020   HDL 20 (L) 11/28/2020   LDLCALC 40 11/28/2020   TRIG 76 11/28/2020     GEN- The patient is well appearing, alert and oriented x 3 today.   Head- normocephalic, atraumatic Eyes-  Sclera clear, conjunctiva pink Ears- hearing intact Oropharynx- clear Neck- supple, no  JVP Lymph- no cervical lymphadenopathy Lungs- Clear to ausculation bilaterally, normal work of breathing Heart- Regular rate and rhythm, no murmurs, rubs or gallops, PMI not laterally displaced GI- soft, NT, ND, + BS Extremities- no clubbing, cyanosis, or edema MS- no significant deformity or atrophy Skin- no rash or lesion Psych- euthymic mood, full affect Neuro- strength and sensation are intact  EKG-NSR at 76 bpm, pr int 162 ms, qrs int 94 ms, qtc 402 ms   EKG's from ER showed atypical atrial flutter as well as afib   Epic records reviewed  Echo- IMPRESSIONS    1. Left ventricular ejection fraction, by estimation, is 55 to 60%. The  left ventricle has normal function. The left ventricle has no regional  wall motion abnormalities. There is moderate left ventricular hypertrophy.  Left ventricular diastolic  parameters were normal.   2. Right ventricular systolic function is normal. The right ventricular  size is normal. There is normal pulmonary artery systolic pressure. The  estimated right ventricular systolic pressure is 22.7 mmHg.   3. The mitral valve is grossly normal. Mild mitral valve regurgitation.   4. The aortic valve is tricuspid. Aortic valve regurgitation is not  visualized. No aortic stenosis is present. Aortic valve mean gradient  measures 3.0 mmHg.   Comparison(s): No prior Echocardiogram.     Assessment and Plan:  1. New onset afib  In SR today  General education re afib and triggers Continue metoprolol and Cardizem at current doses  30 mg cardizem given to use as needed for breakthrough   2. CHA2DS2VASc  of 2 Continue eliquis at 5 mg bid   3. Snoring Sleep study ordered   4. HTN Stable   5. BMI of 38.40 We discussed that pt may be able to water exercise for now until recuperates form knee surgery  Diet modification Pt is trying and has lost 30 lbs recently    F/u with Dr. Diona Browner as scheduled 10/11  Afib as needed    Lupita Leash C[facility] [address] [place], [place] [address] [phone]

## 2020-12-20 ENCOUNTER — Encounter: Payer: BC Managed Care – PPO | Attending: Internal Medicine | Admitting: Nutrition

## 2020-12-20 ENCOUNTER — Other Ambulatory Visit: Payer: Self-pay

## 2020-12-20 ENCOUNTER — Telehealth: Payer: Self-pay | Admitting: *Deleted

## 2020-12-20 ENCOUNTER — Encounter: Payer: Self-pay | Admitting: Nutrition

## 2020-12-20 VITALS — Ht 75.5 in | Wt 309.0 lb

## 2020-12-20 DIAGNOSIS — E1165 Type 2 diabetes mellitus with hyperglycemia: Secondary | ICD-10-CM | POA: Insufficient documentation

## 2020-12-20 DIAGNOSIS — I10 Essential (primary) hypertension: Secondary | ICD-10-CM | POA: Diagnosis present

## 2020-12-20 DIAGNOSIS — E118 Type 2 diabetes mellitus with unspecified complications: Secondary | ICD-10-CM | POA: Diagnosis not present

## 2020-12-20 DIAGNOSIS — E669 Obesity, unspecified: Secondary | ICD-10-CM | POA: Diagnosis present

## 2020-12-20 DIAGNOSIS — IMO0002 Reserved for concepts with insufficient information to code with codable children: Secondary | ICD-10-CM

## 2020-12-20 NOTE — Telephone Encounter (Signed)
-----   Message from Shona Simpson, RN sent at 12/15/2020 11:51 AM EDT ----- Regarding: sleep study Pt for sleep study for afib, snoring, daytime somnolence per donna carroll Thanks stacy

## 2020-12-20 NOTE — Progress Notes (Signed)
Medical Nutrition Therapy  Appointment Start time:  0800  Appointment End time:  0900  Primary concerns today: Diabetes and Obesity  Referral diagnosis: E11.8, E66.9 Preferred learning style: no preference. Learning readiness: ready    NUTRITION ASSESSMENT  Just had knee surgery and got diagnosed with AFIB. He wants to work on losing weight, improve his DM and his AFib. Has lost about 25 lbs in the last 6 months. He would like to get down to 260's. Use to weigh close to 340 lbs. PCP [facility]  Anthropometrics  Wt Readings from Last 3 Encounters:  12/20/20 (!) 309 lb (140.2 kg)  12/15/20 (!) 307 lb 3.2 oz (139.3 kg)  11/28/20 (!) 314 lb (142.4 kg)   Ht Readings from Last 3 Encounters:  12/20/20 6' 3.5" (1.918 m)  12/15/20 6\' 3"  (1.905 m)  11/28/20 6\' 3"  (1.905 m)   Body mass index is 38.11 kg/m. @BMIFA @ Facility age limit for growth percentiles is 20 years. Facility age limit for growth percentiles is 20 years.   Lab Results  Component Value Date   HGBA1C 7.0 (H) 11/28/2020   CMP Latest Ref Rng & Units 11/29/2020 11/28/2020 11/14/2020  Glucose 70 - 99 mg/dL 12/01/2020) 11/30/2020) 01/14/2021)  BUN 6 - 20 mg/dL 7 11 14   Creatinine 0.61 - 1.24 mg/dL 294(T 654(Y 503(T  Sodium 135 - 145 mmol/L 139 137 131(L)  Potassium 3.5 - 5.1 mmol/L 4.2 3.4(L) 3.6  Chloride 98 - 111 mmol/L 105 106 99  CO2 22 - 32 mmol/L 26 21(L) 25  Calcium 8.9 - 10.3 mg/dL ) 4.65) 6.81)  Total Protein 6.5 - 8.1 g/dL - - 6.3(L)  Total Bilirubin 0.3 - 1.2 mg/dL - - 0.9  Alkaline Phos 38 - 126 U/L - - 129(H)  AST 15 - 41 U/L - - 16  ALT 0 - 44 U/L - - 18   Lipid Panel     Component Value Date/Time   CHOL 75 11/28/2020 0530   TRIG 76 11/28/2020 0530   HDL 20 (L) 11/28/2020 0530   CHOLHDL 3.8 11/28/2020 0530   VLDL 15 11/28/2020 0530   LDLCALC 40 11/28/2020 0530     Clinical Medical Hx: Obesity, DM, Knee replacement, Hyperlipidemia, HTN Medications: Metformin 1500 a day Labs:  Lab Results   Component Value Date   HGBA1C 7.0 (H) 11/28/2020   CMP Latest Ref Rng & Units 11/29/2020 11/28/2020 11/14/2020  Glucose 70 - 99 mg/dL 11/30/2020) 12/01/2020) 11/30/2020)  BUN 6 - 20 mg/dL 7 11 14   Creatinine 0.61 - 1.24 mg/dL 01/14/2021 496(P 591(M  Sodium 135 - 145 mmol/L 139 137 131(L)  Potassium 3.5 - 5.1 mmol/L 4.2 3.4(L) 3.6  Chloride 98 - 111 mmol/L 105 106 99  CO2 22 - 32 mmol/L 26 21(L) 25  Calcium 8.9 - 10.3 mg/dL 384(Y) ) 6.59)  Total Protein 6.5 - 8.1 g/dL - - 6.3(L)  Total Bilirubin 0.3 - 1.2 mg/dL - - 0.9  Alkaline Phos 38 - 126 U/L - - 129(H)  AST 15 - 41 U/L - - 16  ALT 0 - 44 U/L - - 18    Notable Signs/Symptoms: None Lifestyle & Dietary Hx Just had a knee replacement July 22nd, 2022. Lives with wife and family. Going to PT for his knee replacement.  Estimated daily fluid intake: 64 oz Supplements: none Sleep: hours Stress / self-care:  Current average weekly physical activity: PT right now  24-Hr Dietary Recall First Meal: 2 pieces cheese toast and water Snack:  Second Meal: grilled chicken tips, mushrooms/onions, [place] rice, water Snack: SF jello occassionally Third Meal: Roast, potatoes, carrots, green beans, water Snack:  1/2 Keto bar Beverages: 84 oz  Estimated Energy Needs Calories: 1800-2000 Carbohydrate: 200g Protein: 135g Fat: 50g   NUTRITION DIAGNOSIS  NB-1.1 Food and nutrition-related knowledge deficit As related to Diabetes Type 2 and Obesity.  As evidenced by BMI 38 and A1C 7%.   NUTRITION INTERVENTION  Nutrition education (E-1) on the following topics:  Nutrition and Diabetes education provided on My Plate, CHO counting, meal planning, portion sizes, timing of meals, avoiding snacks between meals unless having a low blood sugar, target ranges for A1C and blood sugars, signs/symptoms and treatment of hyper/hypoglycemia, monitoring blood sugars, taking medications as prescribed, benefits of exercising 30 minutes per day and prevention of complications of  DM. Weight loss tips  Handouts Provided Include  My Plate Meal Plan Card Diabetes instructions.   Learning Style & Readiness for Change Teaching method utilized: Visual & Auditory  Demonstrated degree of understanding via: Teach Back  Barriers to learning/adherence to lifestyle change: none  Goals Established by Pt Follow My Plate Eat meals on time Increase plant based foods with all meals. Drink a gallon of water per day Eat 45-60 g CHO at each meals(3-4 carb choices) Do not eat past 7 pm. Take Metformin after meals. Lose 1 lb per week    MONITORING & EVALUATION Dietary intake, weekly physical activity, and blood sugars in 3 months.  Next Steps  Patient is to work on meal planning and portion control.

## 2020-12-20 NOTE — Patient Instructions (Signed)
Goals Established by Pt Follow My Plate Eat meals on time Increase plant based foods with all meals. Drink a gallon of water per day Eat 45-60 g CHO at each meals(3-4 carb choices) Do not eat past 7 pm. Take Metformin after meals. Lose 1 lb per week

## 2020-12-20 NOTE — Telephone Encounter (Signed)
Left sleep study appointment details on voice mail. 

## 2020-12-22 ENCOUNTER — Encounter: Payer: Self-pay | Admitting: Physician Assistant

## 2020-12-22 ENCOUNTER — Ambulatory Visit (INDEPENDENT_AMBULATORY_CARE_PROVIDER_SITE_OTHER): Payer: BC Managed Care – PPO | Admitting: Physician Assistant

## 2020-12-22 ENCOUNTER — Other Ambulatory Visit: Payer: Self-pay

## 2020-12-22 ENCOUNTER — Telehealth: Payer: Self-pay | Admitting: Orthopaedic Surgery

## 2020-12-22 DIAGNOSIS — Z96651 Presence of right artificial knee joint: Secondary | ICD-10-CM

## 2020-12-22 NOTE — Progress Notes (Signed)
HPI: [name] comes in today for follow-up status post right total knee arthroplasty 10/30/2020.  He feels he is overall making good progress in regards to his flexion.  He is concerned about the extension of his knee.  Since he has been seen in the office he did undergo work-up for DVT which was negative.  Unfortunately he has had other issues going on including new onset of A. fib, kidney stones and COVID.  He is taking Tylenol for pain.  He is currently on Eliquis due to his new onset of A. fib.  Physical exam: Right knee lacks full extension by 5 to 7 degrees.  Flexion to 100 1520 degrees.  No gross instability.  Surgical incisions well-healed.  Calf supple nontender.  Impression: Status post right total knee arthroplasty 8 weeks  Plan: He will continue to work with therapy on range of motion strengthening the knee.  Continue to work on scar tissue mobilization.  He will continue to be out of work until follow-up in 1 month.  Follow-up with Dr. Magnus [name] in 1 month and we will reevaluate his work status at that time.  Questions were encouraged and answered.

## 2020-12-22 NOTE — Telephone Encounter (Signed)
Received $75.00 cash,3 medical records release forms and disability paperwork from patient. Forwarding to [employer] today  Note:  Patient also said [employer] did not receive a copy of his paperwork from the last time he completed paperwork for them nor did he receive his copy of the paperwork.  The number to contact patient is [phone]

## 2020-12-30 ENCOUNTER — Telehealth: Payer: Self-pay

## 2020-12-30 NOTE — Telephone Encounter (Signed)
Debbie aware of the below message

## 2020-12-30 NOTE — Telephone Encounter (Signed)
Eunice Blase called wondering if it was okay if they could do some dry needling on the patient since he cant get his leg all the way straight.   Call back # [phone]

## 2021-01-04 ENCOUNTER — Other Ambulatory Visit: Payer: Self-pay

## 2021-01-04 ENCOUNTER — Ambulatory Visit: Payer: BC Managed Care – PPO | Attending: Nurse Practitioner | Admitting: Cardiovascular Disease

## 2021-01-04 DIAGNOSIS — I480 Paroxysmal atrial fibrillation: Secondary | ICD-10-CM

## 2021-01-04 DIAGNOSIS — G4733 Obstructive sleep apnea (adult) (pediatric): Secondary | ICD-10-CM

## 2021-01-04 DIAGNOSIS — R0683 Snoring: Secondary | ICD-10-CM | POA: Diagnosis not present

## 2021-01-17 ENCOUNTER — Encounter: Payer: Self-pay | Admitting: Cardiology

## 2021-01-17 NOTE — Progress Notes (Signed)
Cardiology Office Note  Date: 01/18/2021   ID: [name] [name], DOB 1971/02/19, MRN [id number]  PCP:  [facility]  Cardiologist:  Nona Dell, MD Electrophysiologist:  None   Chief Complaint  Patient presents with   Cardiac follow-up    History of Present Illness: [name] [name] is a 50 y.o. male former patient of Dr. Purvis Sheffield, followed in the interim by Dr. Sharrell Ku with [facility] cardiology, and most recently seen in the atrial fibrillation clinic in September, now presenting to establish follow-up with me.  I reviewed his records and updated the chart.  He is here for a routine visit.  He states that he has not had any significant palpitations in the last month, has not had to use any short acting Cardizem.  I reviewed his medications which are outlined below.  He reports compliance with therapy.  Also reports undergoing recent sleep testing, awaits results.  I personally reviewed his ECG today which shows sinus bradycardia.  Past Medical History:  Diagnosis Date   Arthritis    GERD (gastroesophageal reflux disease)    Gout    History of kidney stones    History of pneumonia    Hypertension    Paroxysmal atrial fibrillation (HCC)    Type 2 diabetes mellitus (HCC)     Past Surgical History:  Procedure Laterality Date   CYSTOSCOPY/RETROGRADE/URETEROSCOPY/STONE EXTRACTION WITH BASKET Bilateral 08/29/2016   Procedure: CYSTOSCOPY/BILATERAL RETEROGRADE BILATERTAL URETEROSCOPY/STONE EXTRACTION WITH BASKET, RIGHT STENT PLACEMENT, HOLIUM LASER;  Surgeon: Bjorn Pippin, MD;  Location: [facility];  Service: Urology;  Laterality: Bilateral;   KNEE SURGERY     x 4   STONE EXTRACTION WITH BASKET     STRABISMUS SURGERY Right 04/02/2015   Procedure: REPAIR STRABISMUS RIGHT EYE;  Surgeon: Verne Carrow, MD;  Location: [facility];  Service: Ophthalmology;  Laterality: Right;   TOTAL KNEE ARTHROPLASTY Right 10/29/2020   Procedure: RIGHT TOTAL KNEE ARTHROPLASTY;   Surgeon: Kathryne Hitch, MD;  Location: [facility];  Service: Orthopedics;  Laterality: Right;    Current Outpatient Medications  Medication Sig Dispense Refill   acetaminophen (TYLENOL) 650 MG CR tablet Take 1,300 mg by mouth every 8 (eight) hours as needed for pain.     apixaban (ELIQUIS) 5 MG TABS tablet Take 1 tablet (5 mg total) by mouth 2 (two) times daily. 60 tablet 3   atorvastatin (LIPITOR) 20 MG tablet Take 20 mg by mouth daily.     diltiazem (CARDIZEM CD) 120 MG 24 hr capsule Take 1 capsule (120 mg total) by mouth daily. 30 capsule 2   diltiazem (CARDIZEM) 30 MG tablet Take 1 tablet every 4 hours AS NEEDED for heart rate >100. 30 tablet 1   fluticasone (FLONASE) 50 MCG/ACT nasal spray Place 1 spray into both nostrils 2 (two) times daily as needed for allergies or rhinitis.     metFORMIN (GLUCOPHAGE) 500 MG tablet Take 500-1,000 mg by mouth in the morning and at bedtime. 1 in am and 2 at bedtime     metoprolol succinate (TOPROL-XL) 100 MG 24 hr tablet Take 1 tablet (100 mg total) by mouth daily. Take with or immediately following a meal. 30 tablet 2   oxyCODONE (OXY IR/ROXICODONE) 5 MG immediate release tablet Take 1-2 tablets (5-10 mg total) by mouth every 6 (six) hours as needed for moderate pain (pain score 4-6). (Patient not taking: No sig reported) 30 tablet 0   No current facility-administered medications for this visit.   Allergies:  Penicillins   ROS: No orthopnea or PND.  Physical Exam: VS:  BP 130/82   Pulse (!) 51   Ht 6' 3.5" (1.918 m)   Wt (!) 303 lb (137.4 kg)   SpO2 99%   BMI 37.37 kg/m , BMI Body mass index is 37.37 kg/m.  Wt Readings from Last 3 Encounters:  01/18/21 (!) 303 lb (137.4 kg)  12/20/20 (!) 309 lb (140.2 kg)  12/15/20 (!) 307 lb 3.2 oz (139.3 kg)    General: Patient appears comfortable at rest. HEENT: Conjunctiva and lids normal, wearing a mask. Neck: Supple, no elevated JVP or carotid bruits, no thyromegaly. Lungs: Clear to  auscultation, nonlabored breathing at rest. Cardiac: Regular rate and rhythm, no S3 or significant systolic murmur, no pericardial rub. Extremities: No pitting edema.  ECG:  An ECG dated 12/15/2020 was personally reviewed today and demonstrated:  Sinus rhythm.  Recent Labwork: 11/14/2020: ALT 18; AST 16 11/28/2020: B Natriuretic Peptide 64.0; Magnesium 1.8; TSH 1.025 11/29/2020: BUN 7; Creatinine, Ser 0.90; Hemoglobin 12.3; Platelets 232; Potassium 4.2; Sodium 139     Component Value Date/Time   CHOL 75 11/28/2020 0530   TRIG 76 11/28/2020 0530   HDL 20 (L) 11/28/2020 0530   CHOLHDL 3.8 11/28/2020 0530   VLDL 15 11/28/2020 0530   LDLCALC 40 11/28/2020 0530    Other Studies Reviewed Today:  Echocardiogram 11/28/2020:  1. Left ventricular ejection fraction, by estimation, is 55 to 60%. The  left ventricle has normal function. The left ventricle has no regional  wall motion abnormalities. There is moderate left ventricular hypertrophy.  Left ventricular diastolic  parameters were normal.   2. Right ventricular systolic function is normal. The right ventricular  size is normal. There is normal pulmonary artery systolic pressure. The  estimated right ventricular systolic pressure is 22.7 mmHg.   3. The mitral valve is grossly normal. Mild mitral valve regurgitation.   4. The aortic valve is tricuspid. Aortic valve regurgitation is not  visualized. No aortic stenosis is present. Aortic valve mean gradient  measures 3.0 mmHg.   Assessment and Plan:  1.  Paroxysmal atrial fibrillation with CHA2DS2-VASc score of 2.  He reports good symptom control at this time, no progressive palpitations.  Continue current doses of Toprol-XL and Cardizem CD.  He has short acting Cardizem 30 mg tablets for breakthrough events.  I reviewed his recent lab work from August.  He does not report any spontaneous bleeding problems.  2.  History of snoring, recent sleep study results pending.  Can refer to [facility] in [place] if further treatment is needed.   Medication Adjustments/Labs and Tests Ordered: Current medicines are reviewed at length with the patient today.  Concerns regarding medicines are outlined above.   Tests Ordered: Orders Placed This Encounter  Procedures   EKG 12-Lead    Medication Changes: No orders of the defined types were placed in this encounter.   Disposition:  Follow up  6 months.  Signed, Jonelle Sidle, MD, [facility] 01/18/2021 9:28 AM    [facility] [address] [place], [place], [place] [address] Phone: [phone]; Fax: [phone]

## 2021-01-18 ENCOUNTER — Ambulatory Visit (INDEPENDENT_AMBULATORY_CARE_PROVIDER_SITE_OTHER): Payer: BC Managed Care – PPO | Admitting: Cardiology

## 2021-01-18 ENCOUNTER — Encounter: Payer: Self-pay | Admitting: Cardiology

## 2021-01-18 VITALS — BP 130/82 | HR 51 | Ht 75.5 in | Wt 303.0 lb

## 2021-01-18 DIAGNOSIS — Z9189 Other specified personal risk factors, not elsewhere classified: Secondary | ICD-10-CM

## 2021-01-18 DIAGNOSIS — I480 Paroxysmal atrial fibrillation: Secondary | ICD-10-CM | POA: Diagnosis not present

## 2021-01-18 NOTE — Patient Instructions (Addendum)

## 2021-01-19 ENCOUNTER — Ambulatory Visit: Payer: BC Managed Care – PPO | Admitting: Orthopaedic Surgery

## 2021-01-26 ENCOUNTER — Telehealth: Payer: Self-pay | Admitting: *Deleted

## 2021-01-26 ENCOUNTER — Encounter: Payer: Self-pay | Admitting: Cardiovascular Disease

## 2021-01-26 NOTE — Procedures (Signed)
[facility]         Patient Name: [name] [name], [name] [name] Date: 01/04/2021 Gender: Male D.O.B: 04/13/1970 Age (years): 50 Referring Provider: Newman Nip Height (inches): 75 Interpreting Physician: Nicki Guadalajara MD, ABSM Weight (lbs): 333 RPSGT: Alfonso Ellis BMI: 42 MRN: [id number] Neck Size: 19.50  CLINICAL INFORMATION Sleep Study Type: NPSG  Indication for sleep study: snoring, atrial fibrillation  Epworth Sleepiness Score: 6  SLEEP STUDY TECHNIQUE As per the AASM Manual for the Scoring of Sleep and Associated Events v2.3 (April 2016) with a hypopnea requiring 4% desaturations.  The channels recorded and monitored were frontal, central and occipital EEG, electrooculogram (EOG), submentalis EMG (chin), nasal and oral airflow, thoracic and abdominal wall motion, anterior tibialis EMG, snore microphone, electrocardiogram, and pulse oximetry.  MEDICATIONS acetaminophen (TYLENOL) 650 MG CR tablet apixaban (ELIQUIS) 5 MG TABS tablet (Expired) atorvastatin (LIPITOR) 20 MG tablet diltiazem (CARDIZEM CD) 120 MG 24 hr capsule (Expired) diltiazem (CARDIZEM) 30 MG tablet fluticasone (FLONASE) 50 MCG/ACT nasal spray metFORMIN (GLUCOPHAGE) 500 MG tablet metoprolol succinate (TOPROL-XL) 100 MG 24 hr tablet (Expired) oxyCODONE (OXY IR/ROXICODONE) 5 MG immediate release tablet Medications self-administered by patient taken the night of the study : N/A  SLEEP ARCHITECTURE The study was initiated at 10:19:14 PM and ended at 5:28:08 AM.  Sleep onset time was 12.2 minutes and the sleep efficiency was 67.6%. The total sleep time was 290 minutes.  Stage REM latency was 165.0 minutes.  The patient spent 9.66% of the night in stage N1 sleep, 56.21% in stage N2 sleep, 22.24% in stage N3 and 11.9% in REM.  Alpha intrusion was absent.  Supine sleep was 16.46%.  RESPIRATORY PARAMETERS The overall apnea/hypopnea index (AHI) was 8.5 per hour.  The respiratory disturbance index (RDI) was 8.5/h. There were 0 total apneas, including 0 obstructive, 0 central and 0 mixed apneas. There were 41 hypopneas and 0 RERAs.  The AHI during Stage REM sleep was 8.7 per hour.  AHI while supine was 44.0 per hour.  The mean oxygen saturation was 92.66%. The minimum SpO2 during sleep was 80.00%.  Moderate snoring was noted during this study.  CARDIAC DATA The 2 lead EKG demonstrated sinus rhythm. The mean heart rate was 49.86 beats per minute. Other EKG findings include: None.  LEG MOVEMENT DATA The total PLMS were 0 with a resulting PLMS index of 0.00. Associated arousal with leg movement index was 0.0 .  IMPRESSIONS - Mild obstructive sleep apnea overall (AHI  8.5/h); however, events were severe with supine sleep (AHI 44.0/h) - Moderate oxygen desaturation to a nadir of 80.00%. - The patient snored with moderate snoring volume. - No cardiac abnormalities were noted during this study. - Clinically significant periodic limb movements did not occur during sleep. No significant associated arousals.  DIAGNOSIS - Obstructive Sleep Apnea (G47.33)  RECOMMENDATIONS - Therapeutic CPAP titration to determine optimal pressure required to alleviate sleep disordered breathing. - Effort should be made to optimize nasal and oropharyngeal patency.  - Positional therapy avoiding supine position during sleep. - Avoid alcohol, sedatives and other CNS depressants that may worsen sleep apnea and disrupt normal sleep architecture. - Sleep hygiene should be reviewed to assess factors that may improve sleep quality. - Weight management (BMI 42) and regular exercise should be initiated  or continued if appropriate.  [Electronically signed] 01/26/2021 09:01 PM  Nicki Guadalajara MD, [facility], ABSM Diplomate, American Board of Sleep Medicine   NPI: [id number]  [facility] PH: [phone]   FX: [phone] ACCREDITED BY THE AMERICAN  ACADEMY OF SLEEP MEDICINE

## 2021-01-26 NOTE — Telephone Encounter (Signed)
Patient called to inquire about the results of his sleep study. Informed him that the study has not been read yet. Once it has, I will call him with the results and recommendations. Patient thanked me for talking to him. States he only called because the afib clinic told him that he would be getting a call from [facility]. He has been trying to call [facility] but could never "get anybody."

## 2021-01-27 ENCOUNTER — Ambulatory Visit (INDEPENDENT_AMBULATORY_CARE_PROVIDER_SITE_OTHER): Payer: BC Managed Care – PPO | Admitting: Orthopaedic Surgery

## 2021-01-27 ENCOUNTER — Encounter: Payer: Self-pay | Admitting: Orthopaedic Surgery

## 2021-01-27 DIAGNOSIS — Z96651 Presence of right artificial knee joint: Secondary | ICD-10-CM

## 2021-01-27 NOTE — Progress Notes (Signed)
The patient is now right at 59-month status post a right total knee arthroplasty.  His postoperative course was complicated by atrial fibrillation.  He is on blood thinners now.  He is on medications to help with his heart rate as well.  He has been going to physical therapy.  He feels like he would benefit from more therapy and I agree as well.  His right knee is still swollen to be expected at 3 months.  He lacks full extension by about 3 degrees but his flexion is good and full.  The knee feels ligamentously stable.  From my standpoint we will keep him out of work until the 16th November when he can resume full work duties without restrictions.  He will let us know if we need to fax more orders for continuing his physical therapy because I agree with him having this for at least another month.  He will push himself hard as well.  All questions and concerns were answered and addressed.

## 2021-01-31 ENCOUNTER — Telehealth: Payer: Self-pay | Admitting: Orthopaedic Surgery

## 2021-01-31 NOTE — Telephone Encounter (Signed)
Received $25.00 cash,medical records release form and disability paperwork from patient/forwarding to [employer] today °

## 2021-02-04 ENCOUNTER — Other Ambulatory Visit: Payer: Self-pay | Admitting: Cardiovascular Disease

## 2021-02-04 ENCOUNTER — Telehealth: Payer: Self-pay | Admitting: *Deleted

## 2021-02-04 DIAGNOSIS — G4733 Obstructive sleep apnea (adult) (pediatric): Secondary | ICD-10-CM

## 2021-02-04 NOTE — Telephone Encounter (Signed)
Left message to return a call to discuss sleep study results and recommendations. 

## 2021-02-04 NOTE — Telephone Encounter (Signed)
Patient returned a call to me and was given his NSPG results and recommendations. He agrees to proceed with CPAP titration.

## 2021-02-04 NOTE — Telephone Encounter (Signed)
-----   Message from Lennette Bihari, MD sent at 01/26/2021  9:08 PM EDT ----- Burna Mortimer, please notify pt of results and schedule CPAP titration study.

## 2021-02-09 ENCOUNTER — Other Ambulatory Visit: Payer: Self-pay | Admitting: Cardiology

## 2021-02-09 MED ORDER — DILTIAZEM HCL ER COATED BEADS 120 MG PO CP24: 120.0000 mg | ORAL_CAPSULE | Freq: Every day | ORAL | 1 refills | Status: AC

## 2021-02-09 MED ORDER — APIXABAN 5 MG PO TABS: 5.0000 mg | ORAL_TABLET | Freq: Two times a day (BID) | ORAL | 6 refills | Status: DC

## 2021-02-09 NOTE — Telephone Encounter (Signed)
Prescription refill request for Eliquis received. Indication: PAF Last office visit: 01/18/21  Ival Bible MD Scr: 0.90 on 11/29/20 Age: 50 Weight: 137.4kg  Based on above findings Eliquis 5mg  twice daily is the appropriate dose.  Refill approved.

## 2021-02-09 NOTE — Telephone Encounter (Signed)
*  STAT* If patient is at the pharmacy, call can be transferred to refill team.   1. Which medications need to be refilled? (please list name of each medication and dose if known)  . metoprolol succinate (TOPROL-XL) 100 MG 24 hr tablet  apixaban (ELIQUIS) 5 MG TABS tablet   diltiazem (CARDIZEM CD) 120 MG 24 hr capsule    2. Which pharmacy/location (including street and city if local pharmacy) is medication to be sent to? [facility] - [place], [place] - [address]  3. Do they need a 30 day or 90 day supply? 30 ds

## 2021-02-10 ENCOUNTER — Telehealth: Payer: Self-pay | Admitting: *Deleted

## 2021-02-10 NOTE — Telephone Encounter (Signed)
Left CPAP titration appointment details on Voice mail.

## 2021-02-22 ENCOUNTER — Other Ambulatory Visit (HOSPITAL_COMMUNITY): Payer: Self-pay | Admitting: Nurse Practitioner

## 2021-03-11 ENCOUNTER — Other Ambulatory Visit: Payer: Self-pay

## 2021-03-11 ENCOUNTER — Ambulatory Visit: Payer: BC Managed Care – PPO | Attending: Cardiovascular Disease | Admitting: Cardiovascular Disease

## 2021-03-11 DIAGNOSIS — G4733 Obstructive sleep apnea (adult) (pediatric): Secondary | ICD-10-CM | POA: Diagnosis present

## 2021-03-25 ENCOUNTER — Encounter: Payer: Self-pay | Admitting: Cardiovascular Disease

## 2021-03-25 ENCOUNTER — Telehealth: Payer: Self-pay | Admitting: *Deleted

## 2021-03-25 NOTE — Procedures (Signed)
[facility]         Patient Name: [name] [name], Ayre Date: 03/11/2021 Gender: Male D.O.B: 05-26-1970 Age (years): 50 Referring Provider: Newman Nip Height (inches): 75 Interpreting Physician: Nicki Guadalajara MD, ABSM Weight (lbs): 333 RPSGT: Peak, Robert BMI: 42 MRN: [id number] Neck Size: 19.50  CLINICAL INFORMATION The patient is referred for a CPAP titration to treat sleep apnea.  Date of NPSG: 01/04/2021:  AHI 8.5/h, REM AHI 8.7/h; supine AHI 44.0%; O2 nadir 80%.   SLEEP STUDY TECHNIQUE As per the AASM Manual for the Scoring of Sleep and Associated Events v2.3 (April 2016) with a hypopnea requiring 4% desaturations.  The channels recorded and monitored were frontal, central and occipital EEG, electrooculogram (EOG), submentalis EMG (chin), nasal and oral airflow, thoracic and abdominal wall motion, anterior tibialis EMG, snore microphone, electrocardiogram, and pulse oximetry. Continuous positive airway pressure (CPAP) was initiated at the beginning of the study and titrated to treat sleep-disordered breathing.  MEDICATIONS acetaminophen (TYLENOL) 650 MG CR tablet apixaban (ELIQUIS) 5 MG TABS tablet atorvastatin (LIPITOR) 20 MG tablet diltiazem (CARDIZEM CD) 120 MG 24 hr capsule diltiazem (CARDIZEM) 30 MG tablet fluticasone (FLONASE) 50 MCG/ACT nasal spray metFORMIN (GLUCOPHAGE) 500 MG tablet metoprolol succinate (TOPROL-XL) 100 MG 24 hr tablet (Expired) oxyCODONE (OXY IR/ROXICODONE) 5 MG immediate release tablet Medications self-administered by patient taken the night of the study : N/A  TECHNICIAN COMMENTS Comments added by technician: Patient was adherent to CPAP pressure and nasal cushion mask. Comments added by scorer: N/A  RESPIRATORY PARAMETERS Optimal PAP Pressure (cm): 6 AHI at Optimal Pressure (/hr): 0.7 Overall Minimal O2 (%): 89.00 Supine % at Optimal Pressure (%): 33 Minimal O2 at Optimal Pressure  (%): 89.0   SLEEP ARCHITECTURE The study was initiated at 9:46:16 PM and ended at 5:10:28 AM.  Sleep onset time was 11.2 minutes and the sleep efficiency was 82.8%. The total sleep time was 368 minutes.  The patient spent 6.25% of the night in stage N1 sleep, 63.99% in stage N2 sleep, 6.93% in stage N3 and 22.8% in REM.Stage REM latency was 96.5 minutes  Wake after sleep onset was 65.0. Alpha intrusion was absent. Supine sleep was 24.86%.  CARDIAC DATA The 2 lead EKG demonstrated sinus rhythm. The mean heart rate was 46.37 beats per minute. Other EKG findings include: None.  LEG MOVEMENT DATA The total Periodic Limb Movements of Sleep (PLMS) were 0. The PLMS index was 0.00. A PLMS index of <15 is considered normal in adults.  IMPRESSIONS - CPAP was initiated at 5 cm and was titrated to 6 cm of water (AHI 0.7/h, RDI 0.7/h, O2 nadir 89%). - Mild oxygen desaturations were observed to a nadir of 89% at 6 cm. - No snoring was audible during this study. - No cardiac abnormalities were observed during this study. - Clinically significant periodic limb movements were not noted during this study. Arousals associated with PLMs were rare.  DIAGNOSIS - Obstructive Sleep Apnea (G47.33)  RECOMMENDATIONS - Recommend an initial trial of CPAP Auto therapy with EPR of 3 at 6 - 10 cm H2O with heated humidification.  A Small Cushion size [employer] Minimal Catering manager Under Nose Frame (M) mask was used for the titration. - Avoid alcohol, sedatives and other CNS depressants that may worsen  sleep apnea and disrupt normal sleep architecture. - Sleep hygiene should be reviewed to assess factors that may improve sleep quality. - Weight management (BMI 42) and regular exercise should be initiated or continued. - Recommend a download  and sleep clinic evaluation after 4 weeks of therapy   [Electronically signed] 03/25/2021 12:15 PM  Shelva Majestic MD, [facility], ABSM Diplomate, American Board of  Sleep Medicine   NPI: [id number]  [place] PH: [phone]   FX: [phone] [place]

## 2021-03-25 NOTE — Telephone Encounter (Signed)
Patient notified CPAP titration has been completed. CPAP order has been sent to [facility].

## 2021-03-25 NOTE — Telephone Encounter (Signed)
-----   Message from Lennette Bihari, MD sent at 03/25/2021 12:21 PM EST ----- Burna Mortimer please notify pt and set up with DME for CPAP inmitiation

## 2021-04-19 ENCOUNTER — Ambulatory Visit: Payer: BC Managed Care – PPO | Admitting: Nutrition

## 2021-05-17 ENCOUNTER — Ambulatory Visit (HOSPITAL_COMMUNITY)
Admission: RE | Admit: 2021-05-17 | Discharge: 2021-05-17 | Disposition: A | Discharge status: Home / Self Care | Payer: 59 | Source: Ambulatory Visit | Attending: Nurse Practitioner | Admitting: Nurse Practitioner

## 2021-05-17 ENCOUNTER — Encounter (HOSPITAL_COMMUNITY): Payer: Self-pay | Admitting: Nurse Practitioner

## 2021-05-17 ENCOUNTER — Other Ambulatory Visit: Payer: Self-pay

## 2021-05-17 VITALS — BP 144/80 | HR 47 | Ht 75.5 in | Wt 305.0 lb

## 2021-05-17 DIAGNOSIS — R0683 Snoring: Secondary | ICD-10-CM | POA: Diagnosis not present

## 2021-05-17 DIAGNOSIS — E119 Type 2 diabetes mellitus without complications: Secondary | ICD-10-CM | POA: Insufficient documentation

## 2021-05-17 DIAGNOSIS — Z79899 Other long term (current) drug therapy: Secondary | ICD-10-CM | POA: Diagnosis not present

## 2021-05-17 DIAGNOSIS — Z96651 Presence of right artificial knee joint: Secondary | ICD-10-CM | POA: Insufficient documentation

## 2021-05-17 DIAGNOSIS — E669 Obesity, unspecified: Secondary | ICD-10-CM | POA: Diagnosis not present

## 2021-05-17 DIAGNOSIS — I4510 Unspecified right bundle-branch block: Secondary | ICD-10-CM | POA: Diagnosis not present

## 2021-05-17 DIAGNOSIS — R001 Bradycardia, unspecified: Secondary | ICD-10-CM | POA: Insufficient documentation

## 2021-05-17 DIAGNOSIS — Z7901 Long term (current) use of anticoagulants: Secondary | ICD-10-CM | POA: Insufficient documentation

## 2021-05-17 DIAGNOSIS — R000 Tachycardia, unspecified: Secondary | ICD-10-CM | POA: Insufficient documentation

## 2021-05-17 DIAGNOSIS — E785 Hyperlipidemia, unspecified: Secondary | ICD-10-CM | POA: Diagnosis not present

## 2021-05-17 DIAGNOSIS — I10 Essential (primary) hypertension: Secondary | ICD-10-CM | POA: Insufficient documentation

## 2021-05-17 DIAGNOSIS — Z6837 Body mass index (BMI) 37.0-37.9, adult: Secondary | ICD-10-CM | POA: Insufficient documentation

## 2021-05-17 DIAGNOSIS — K219 Gastro-esophageal reflux disease without esophagitis: Secondary | ICD-10-CM | POA: Insufficient documentation

## 2021-05-17 DIAGNOSIS — R002 Palpitations: Secondary | ICD-10-CM

## 2021-05-17 DIAGNOSIS — D6869 Other thrombophilia: Secondary | ICD-10-CM | POA: Diagnosis not present

## 2021-05-17 DIAGNOSIS — I480 Paroxysmal atrial fibrillation: Secondary | ICD-10-CM

## 2021-05-17 DIAGNOSIS — N200 Calculus of kidney: Secondary | ICD-10-CM | POA: Insufficient documentation

## 2021-05-17 NOTE — Progress Notes (Signed)
Primary Care Physician: [facility] Referring Physician: [facility] Ed F/u    [name] [name] is a 51 y.o. male with a h/o   HTN, HLD, DM2, GERD, kidney stones, history of tachycardia, presenting with palpitations to the ER 11/29/20.  He has had multiple visits to the ED for tachycardia, post knee surgery  in July, has had work-up including cardiac enzymes, DVT /PE evaluation, thyroid, and outpatient cardiology evaluation. Minimal exertion on ambulation with palpitation, heart rate was as high as 150. Prompted EMS call. Patient was found in A. fib, Cardizem was started..  Patient was brought to the ED.  Pt is now in the afib clinic for f/u ER visit. He was placed on metoprolol and cardizem and is in SR today. He is on eliquis 5 mg bid for a CHA2DS2VASc  score of 2. He will often feel palpitations at night. He admits to snoring. Will order sleep study. He is limited in mobility for his recent knee surgery but is trying to lose weight. No alcohol or tobacco use.    F/u in afib clinic, 05/17/21. He wanted to discuss palpitations that he has noted in the afternoon's. Usually occur after lunch to suppertime. He does not drink caffeine. Overall heart rate does not increase and he describes what sounds like skipped beats. Will place a monitor to further assess.    Today, he denies symptoms of palpitations, chest pain, shortness of breath, orthopnea, PND, lower extremity edema, dizziness, presyncope, syncope, or neurologic sequela. The patient is tolerating medications without difficulties and is otherwise without complaint today.   Past Medical History:  Diagnosis Date   Arthritis    GERD (gastroesophageal reflux disease)    Gout    History of kidney stones    History of pneumonia    Hypertension    Paroxysmal atrial fibrillation (HCC)    Type 2 diabetes mellitus ([place])    Past Surgical History:  Procedure Laterality Date   CYSTOSCOPY/RETROGRADE/URETEROSCOPY/STONE EXTRACTION WITH BASKET  Bilateral 08/29/2016   Procedure: CYSTOSCOPY/BILATERAL RETEROGRADE BILATERTAL URETEROSCOPY/STONE EXTRACTION WITH BASKET, RIGHT STENT PLACEMENT, HOLIUM LASER;  Surgeon: Irine Seal, MD;  Location: [facility];  Service: Urology;  Laterality: Bilateral;   KNEE SURGERY     x 4   STONE EXTRACTION WITH BASKET     STRABISMUS SURGERY Right 04/02/2015   Procedure: REPAIR STRABISMUS RIGHT EYE;  Surgeon: Everitt Amber, MD;  Location: [facility];  Service: Ophthalmology;  Laterality: Right;   TOTAL KNEE ARTHROPLASTY Right 10/29/2020   Procedure: RIGHT TOTAL KNEE ARTHROPLASTY;  Surgeon: Mcarthur Rossetti, MD;  Location: [facility];  Service: Orthopedics;  Laterality: Right;    Current Outpatient Medications  Medication Sig Dispense Refill   acetaminophen (TYLENOL) 650 MG CR tablet Take 650 mg by mouth as needed for pain.     apixaban (ELIQUIS) 5 MG TABS tablet Take 1 tablet (5 mg total) by mouth 2 (two) times daily. 60 tablet 6   atorvastatin (LIPITOR) 40 MG tablet Take 20 mg by mouth daily.     CONTOUR NEXT TEST test strip USE TO test TWICE DAILY     diltiazem (CARDIZEM CD) 120 MG 24 hr capsule Take 1 capsule (120 mg total) by mouth daily. 90 capsule 1   diltiazem (CARDIZEM) 30 MG tablet Take 1 tablet every 4 hours AS NEEDED for heart rate >100. 30 tablet 1   fluticasone (FLONASE) 50 MCG/ACT nasal spray Place 1 spray into both nostrils 2 (two) times daily as needed for allergies or  rhinitis.     metFORMIN (GLUCOPHAGE) 500 MG tablet Take 500-1,000 mg by mouth in the morning and at bedtime. 1 in am and 2 at bedtime     metoprolol succinate (TOPROL-XL) 100 MG 24 hr tablet Take 1 tablet (100 mg total) by mouth daily. Take with or immediately following a meal. 30 tablet 2   oxyCODONE (OXY IR/ROXICODONE) 5 MG immediate release tablet Take 1-2 tablets (5-10 mg total) by mouth every 6 (six) hours as needed for moderate pain (pain score 4-6). (Patient not taking: Reported on 12/15/2020) 30 tablet 0   No  current facility-administered medications for this encounter.    Allergies  Allergen Reactions   Penicillins Other (See Comments)    Has patient had a PCN reaction causing immediate rash, facial/tongue/throat swelling, SOB or lightheadedness with hypotension: Unknown Has patient had a PCN reaction causing severe rash involving mucus membranes or skin necrosis: Unknown Has patient had a PCN reaction that required hospitalization: Unknown Has patient had a PCN reaction occurring within the last 10 years: No Childhood reaction If all of the above answers are "NO", then may proceed with Cephalosporin use.     Social History   Socioeconomic History   Marital status: Married    Spouse name: Not on file   Number of children: Not on file   Years of education: Not on file   Highest education level: Not on file  Occupational History   Not on file  Tobacco Use   Smoking status: Never   Smokeless tobacco: Never  Vaping Use   Vaping Use: Never used  Substance and Sexual Activity   Alcohol use: No   Drug use: No   Sexual activity: Not on file  Other Topics Concern   Not on file  Social History Narrative   Not on file   Social Determinants of Health   Financial Resource Strain: Not on file  Food Insecurity: Not on file  Transportation Needs: Not on file  Physical Activity: Not on file  Stress: Not on file  Social Connections: Not on file  Intimate Partner Violence: Not on file    Family History  Problem Relation Age of Onset   Hypertension Father    Cancer Father    Cancer Maternal Grandmother    Cancer Maternal Grandfather    Cancer Paternal Grandmother    Heart failure Paternal Grandmother     ROS- All systems are reviewed and negative except as per the HPI above  Physical Exam: Vitals:   05/17/21 0830  Weight: (!) 138.3 kg  Height: 6' 3.5" (1.918 m)   Wt Readings from Last 3 Encounters:  05/17/21 (!) 138.3 kg  01/18/21 (!) 137.4 kg  12/20/20 (!) 140.2 kg     Labs: Lab Results  Component Value Date   NA 139 11/29/2020   K 4.2 11/29/2020   CL 105 11/29/2020   CO2 26 11/29/2020   GLUCOSE 162 (H) 11/29/2020   BUN 7 11/29/2020   CREATININE 0.90 11/29/2020   CALCIUM 8.7 (L) 11/29/2020   MG 1.8 11/28/2020   Lab Results  Component Value Date   INR 1.1 11/29/2020   Lab Results  Component Value Date   CHOL 75 11/28/2020   HDL 20 (L) 11/28/2020   LDLCALC 40 11/28/2020   TRIG 76 11/28/2020     GEN- The patient is well appearing, alert and oriented x 3 today.   Head- normocephalic, atraumatic Eyes-  Sclera clear, conjunctiva pink Ears- hearing intact Oropharynx- clear Neck-  supple, no JVP Lymph- no cervical lymphadenopathy Lungs- Clear to ausculation bilaterally, normal work of breathing Heart- Regular rate and rhythm, no murmurs, rubs or gallops, PMI not laterally displaced GI- soft, NT, ND, + BS Extremities- no clubbing, cyanosis, or edema MS- no significant deformity or atrophy Skin- no rash or lesion Psych- euthymic mood, full affect Neuro- strength and sensation are intact  EKG-Sinus brady at 47 bpm, pr int 188 ms, qrs int 102 ms, qtc 366 ms  Epic records reviewed  Echo- IMPRESSIONS    1. Left ventricular ejection fraction, by estimation, is 55 to 60%. The  left ventricle has normal function. The left ventricle has no regional  wall motion abnormalities. There is moderate left ventricular hypertrophy.  Left ventricular diastolic  parameters were normal.   2. Right ventricular systolic function is normal. The right ventricular  size is normal. There is normal pulmonary artery systolic pressure. The  estimated right ventricular systolic pressure is [id number] mmHg.   3. The mitral valve is grossly normal. Mild mitral valve regurgitation.   4. The aortic valve is tricuspid. Aortic valve regurgitation is not  visualized. No aortic stenosis is present. Aortic valve mean gradient  measures 3.0 mmHg.   Comparison(s): No prior  Echocardiogram.     Assessment and Plan:  1. New onset afib  In Sinus brady today  Describing what sounds like premature contractions  Will place a one week ZIO patch Continue metoprolol and Cardizem at current doses  If significant bradycardia is seen and no significant arrhythmia's,   will consider lowering rate control   2. CHA2DS2VASc  of 2 Continue eliquis at 5 mg bid   3. Snoring Uses cpap   4. HTN Stable   5. BMI of 37.62 Diet modification/exercise for weight loss   F/u with Dr. Domenic Polite as scheduled  [facility] as needed  I will call with results  of monitor    Butch Penny C. Kanden Carey, [facility] [address] [place], [place] [address] [phone]

## 2021-06-03 ENCOUNTER — Encounter (HOSPITAL_COMMUNITY): Payer: Self-pay | Admitting: *Deleted

## 2021-07-28 ENCOUNTER — Ambulatory Visit: Payer: 59 | Admitting: Orthopaedic Surgery

## 2021-07-29 ENCOUNTER — Ambulatory Visit: Payer: BC Managed Care – PPO | Admitting: Cardiology

## 2021-08-08 NOTE — Progress Notes (Signed)
? ? ?Cardiology Office Note ? ?Date: 08/09/2021  ? ?ID: [name] [name], DOB 1970/05/22, MRN [id number] ? ?PCP:  [facility]  ?Cardiologist:  Nona Dell, MD ?Electrophysiologist:  None  ? ?Chief Complaint  ?Patient presents with  ? Cardiac follow-up  ? ? ?History of Present Illness: ?[name] [name] is a 51 y.o. male last seen in the atrial fibrillation clinic back in February, I reviewed the note.  He is here for a routine visit.  Reports no rapid sense of palpitations.  Has been experiencing intermittent skips mainly in the afternoons, no sudden dizziness or syncope.  Interval cardiac monitor did not show any atrial fibrillation, but he did have 4.4% PVCs likely corresponding to his sense of skips. ? ?CHA2DS2-VASc score is 2.  He is on Eliquis for stroke prophylaxis.  Otherwise continues on stable doses of Cardizem CD and Toprol-XL. ? ?Past Medical History:  ?Diagnosis Date  ? Arthritis   ? GERD (gastroesophageal reflux disease)   ? Gout   ? History of kidney stones   ? History of pneumonia   ? Hypertension   ? Paroxysmal atrial fibrillation (HCC)   ? Type 2 diabetes mellitus (HCC)   ? ? ?Past Surgical History:  ?Procedure Laterality Date  ? CYSTOSCOPY/RETROGRADE/URETEROSCOPY/STONE EXTRACTION WITH BASKET Bilateral 08/29/2016  ? Procedure: CYSTOSCOPY/BILATERAL RETEROGRADE BILATERTAL URETEROSCOPY/STONE EXTRACTION WITH BASKET, RIGHT STENT PLACEMENT, HOLIUM LASER;  Surgeon: Bjorn Pippin, MD;  Location: [facility];  Service: Urology;  Laterality: Bilateral;  ? KNEE SURGERY    ? x 4  ? STONE EXTRACTION WITH BASKET    ? STRABISMUS SURGERY Right 04/02/2015  ? Procedure: REPAIR STRABISMUS RIGHT EYE;  Surgeon: Verne Carrow, MD;  Location: [facility];  Service: Ophthalmology;  Laterality: Right;  ? TOTAL KNEE ARTHROPLASTY Right 10/29/2020  ? Procedure: RIGHT TOTAL KNEE ARTHROPLASTY;  Surgeon: Kathryne Hitch, MD;  Location: [facility];  Service: Orthopedics;  Laterality: Right;  ? ? ?Current  Outpatient Medications  ?Medication Sig Dispense Refill  ? acetaminophen (TYLENOL) 650 MG CR tablet Take 650 mg by mouth as needed for pain.    ? apixaban (ELIQUIS) 5 MG TABS tablet Take 1 tablet (5 mg total) by mouth 2 (two) times daily. 60 tablet 6  ? atorvastatin (LIPITOR) 40 MG tablet Take 20 mg by mouth daily.    ? CONTOUR NEXT TEST test strip USE TO test TWICE DAILY    ? diltiazem (CARDIZEM CD) 120 MG 24 hr capsule Take 1 capsule (120 mg total) by mouth daily. 90 capsule 1  ? diltiazem (CARDIZEM) 30 MG tablet Take 1 tablet every 4 hours AS NEEDED for heart rate >100. 30 tablet 1  ? fluticasone (FLONASE) 50 MCG/ACT nasal spray Place 1 spray into both nostrils 2 (two) times daily as needed for allergies or rhinitis.    ? metFORMIN (GLUCOPHAGE) 500 MG tablet Take 500-1,000 mg by mouth in the morning and at bedtime. 1 in am and 2 at bedtime    ? metoprolol succinate (TOPROL-XL) 100 MG 24 hr tablet Take 1 tablet (100 mg total) by mouth daily. Take with or immediately following a meal. 30 tablet 2  ? ?No current facility-administered medications for this visit.  ? ?Allergies:  Penicillins  ? ?ROS: No orthopnea or PND. ? ?Physical Exam: ?VS:  BP 118/78   Pulse (!) 49   Ht 6\' 4"  (1.93 m)   Wt (!) 316 lb 12.8 oz (143.7 kg)   SpO2 100%   BMI 38.56 kg/m? ,  BMI Body mass index is 38.56 kg/m?. ? ?Wt Readings from Last 3 Encounters:  ?08/09/21 (!) 316 lb 12.8 oz (143.7 kg)  ?05/17/21 (!) 305 lb (138.3 kg)  ?01/18/21 (!) 303 lb (137.4 kg)  ?  ?General: Patient appears comfortable at rest. ?HEENT: Conjunctiva and lids normal. ?Neck: Supple, no elevated JVP or carotid bruits, no thyromegaly. ?Lungs: Clear to auscultation, nonlabored breathing at rest. ?Cardiac: Regular rate and rhythm, no S3 or significant systolic murmur, no pericardial rub. ?Extremities: No pitting edema. ? ?ECG:  An ECG dated 05/17/2021 was personally reviewed today and demonstrated:  Sinus bradycardia with R' in lead V1. ? ?Recent Labwork: ?11/14/2020:  ALT 18; AST 16 ?11/28/2020: B Natriuretic Peptide 64.0; Magnesium 1.8; TSH 1.025 ?11/29/2020: BUN 7; Creatinine, Ser 0.90; Hemoglobin 12.3; Platelets 232; Potassium 4.2; Sodium 139  ?   ?Component Value Date/Time  ? CHOL 75 11/28/2020 0530  ? TRIG 76 11/28/2020 0530  ? HDL 20 (L) 11/28/2020 0530  ? CHOLHDL 3.8 11/28/2020 0530  ? VLDL 15 11/28/2020 0530  ? LDLCALC 40 11/28/2020 0530  ? ? ?Other Studies Reviewed Today: ? ?Echocardiogram 11/28/2020: ? 1. Left ventricular ejection fraction, by estimation, is 55 to 60%. The  ?left ventricle has normal function. The left ventricle has no regional  ?wall motion abnormalities. There is moderate left ventricular hypertrophy.  ?Left ventricular diastolic  ?parameters were normal.  ? 2. Right ventricular systolic function is normal. The right ventricular  ?size is normal. There is normal pulmonary artery systolic pressure. The  ?estimated right ventricular systolic pressure is 22.7 mmHg.  ? 3. The mitral valve is grossly normal. Mild mitral valve regurgitation.  ? 4. The aortic valve is tricuspid. Aortic valve regurgitation is not  ?visualized. No aortic stenosis is present. Aortic valve mean gradient  ?measures 3.0 mmHg.  ? ?Cardiac monitor February 2023: ?Patch Wear Time:  6 days and 21 hours ?  ?Predominant underlying rhythm was sinus rhythm ?4.4% ventricular ectopy ?Triggered events associated with sinus rhythm and sinus rhythm with PVCs ? ?Assessment and Plan: ? ?1.  Paroxysmal atrial fibrillation with CHA2DS2-VASc score of 2.  Plan to continue Eliquis for stroke prophylaxis, also combination of Toprol-XL and Cardizem CD. ? ?2.  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  We will plan to update his echocardiogram to ensure stable LVEF. ? ?Medication Adjustments/Labs and Tests Ordered: ?Current medicines are reviewed at length with the patient today.  Concerns regarding medicines are outlined above.  ? ?Tests Ordered: ?Orders Placed This Encounter   ?Procedures  ? ECHOCARDIOGRAM COMPLETE  ? ? ?Medication Changes: ?No orders of the defined types were placed in this encounter. ? ? ?Disposition:  Follow up  6 months. ? ?Signed, ?Jonelle Sidle, MD, [facility] ?08/09/2021 10:40 AM    ?[facility] ?[address] [place], [place], [place] [address] ?Phone: [phone]; Fax: [phone]  ?

## 2021-08-09 ENCOUNTER — Encounter: Payer: Self-pay | Admitting: Cardiology

## 2021-08-09 ENCOUNTER — Ambulatory Visit: Payer: 59 | Admitting: Cardiology

## 2021-08-09 VITALS — BP 118/78 | HR 49 | Ht 76.0 in | Wt 316.8 lb

## 2021-08-09 DIAGNOSIS — I480 Paroxysmal atrial fibrillation: Secondary | ICD-10-CM

## 2021-08-09 DIAGNOSIS — I493 Ventricular premature depolarization: Secondary | ICD-10-CM

## 2021-08-09 NOTE — Patient Instructions (Signed)

## 2021-08-29 ENCOUNTER — Ambulatory Visit (INDEPENDENT_AMBULATORY_CARE_PROVIDER_SITE_OTHER): Payer: 59

## 2021-08-29 DIAGNOSIS — I480 Paroxysmal atrial fibrillation: Secondary | ICD-10-CM

## 2021-08-29 LAB — ECHOCARDIOGRAM COMPLETE
AR max vel: 3.96 cm2
AV Area VTI: 4.27 cm2
AV Area mean vel: 3.55 cm2
AV Mean grad: 1.9 mmHg
AV Peak grad: 3.6 mmHg
Ao pk vel: 0.95 m/s
Area-P 1/2: 2.96 cm2
Calc EF: 66.7 %
MV M vel: 3.62 m/s
MV Peak grad: 52.4 mmHg
S' Lateral: 2.27 cm
Single Plane A2C EF: 67.6 %
Single Plane A4C EF: 60.4 %

## 2021-10-07 ENCOUNTER — Other Ambulatory Visit: Payer: Self-pay | Admitting: Cardiology

## 2021-10-07 DIAGNOSIS — I480 Paroxysmal atrial fibrillation: Secondary | ICD-10-CM

## 2021-10-07 NOTE — Telephone Encounter (Signed)
Eliquis 5mg  refill request received. Patient is 51 years old, weight-143.7kg, Crea-0.88 on 06/07/2021 via [facility] from [place], Diagnosis-Afib, and last seen by Dr. Republic on 08/09/2021. Dose is appropriate based on dosing criteria. Will send in refill to requested pharmacy.

## 2021-10-14 ENCOUNTER — Ambulatory Visit (HOSPITAL_COMMUNITY)
Admission: RE | Admit: 2021-10-14 | Discharge: 2021-10-14 | Disposition: A | Discharge status: Home / Self Care | Payer: 59 | Source: Ambulatory Visit | Attending: Nurse Practitioner | Admitting: Nurse Practitioner

## 2021-10-14 DIAGNOSIS — R002 Palpitations: Secondary | ICD-10-CM

## 2021-11-07 ENCOUNTER — Other Ambulatory Visit: Payer: Self-pay | Admitting: Physician Assistant

## 2021-11-07 DIAGNOSIS — M79604 Pain in right leg: Secondary | ICD-10-CM

## 2021-11-07 DIAGNOSIS — Z96651 Presence of right artificial knee joint: Secondary | ICD-10-CM

## 2021-11-09 IMAGING — CT CT ANGIO CHEST
2 of 6 series · 18 of 46 positions shown · IV contrast (Omnipaque or Isovue)
Comparison: 03/23/2019

CLINICAL DATA: PE suspected

EXAM:
CT ANGIOGRAPHY CHEST WITH CONTRAST
TECHNIQUE: Multidetector CT imaging of the chest was performed using the
standard protocol during bolus administration of intravenous
contrast. Multiplanar CT image reconstructions and MIPs were
obtained to evaluate the vascular anatomy.
CONTRAST:  100mL OMNIPAQUE IOHEXOL 350 MG/ML SOLN

[Series 5: pe axial thins · axial · 0.86mm/px · z∈[+1092,+1360]mm · 15 of 368 slices shown]
[im 16/368  lung]
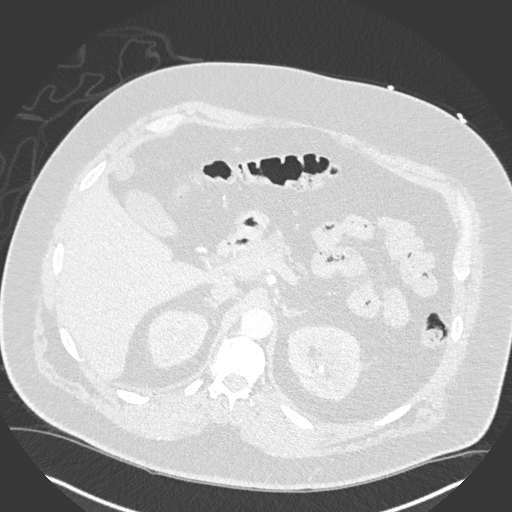
[im 46/368  soft-tissue]
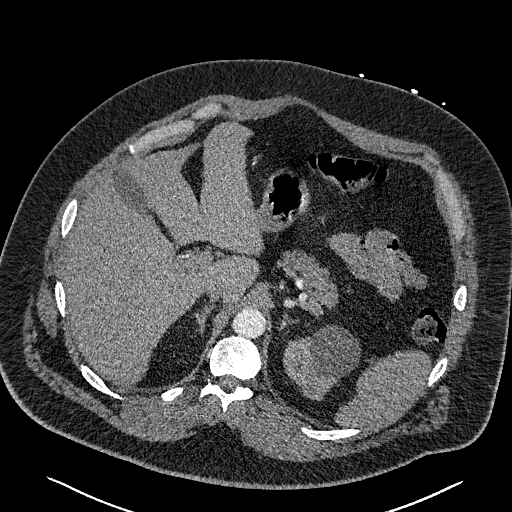
[im 62/368  lung]
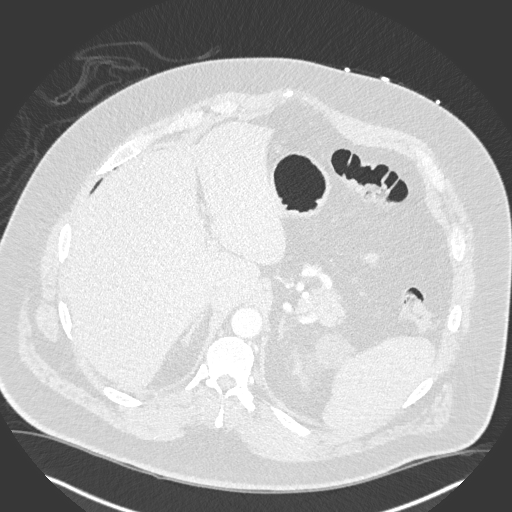
[im 92/368  soft-tissue]
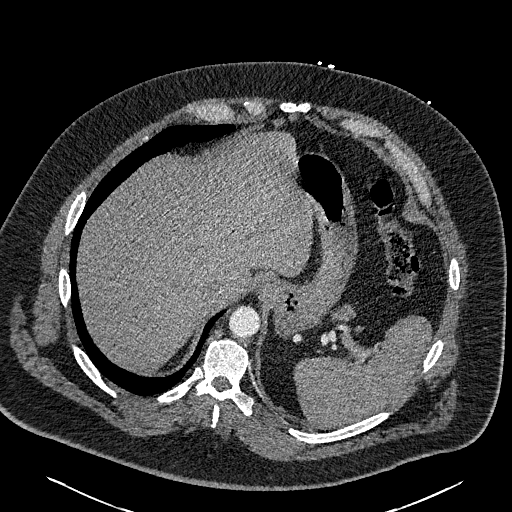
[im 108/368  lung]
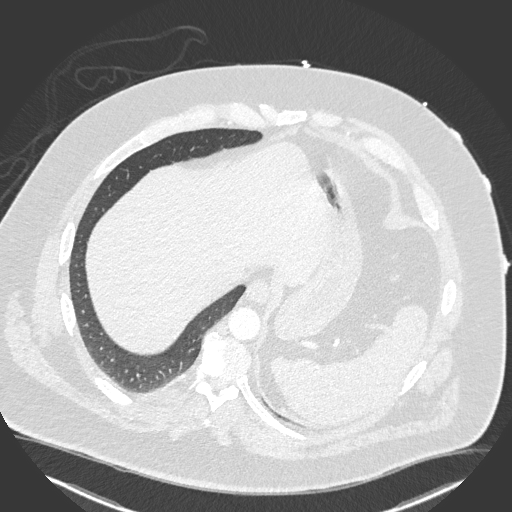
[im 138/368  soft-tissue]
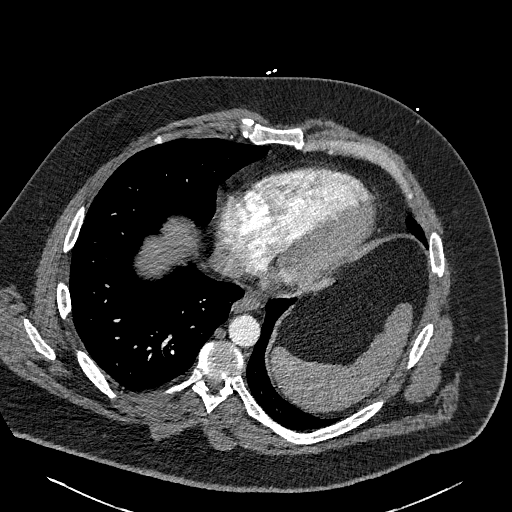
[im 153/368  lung]
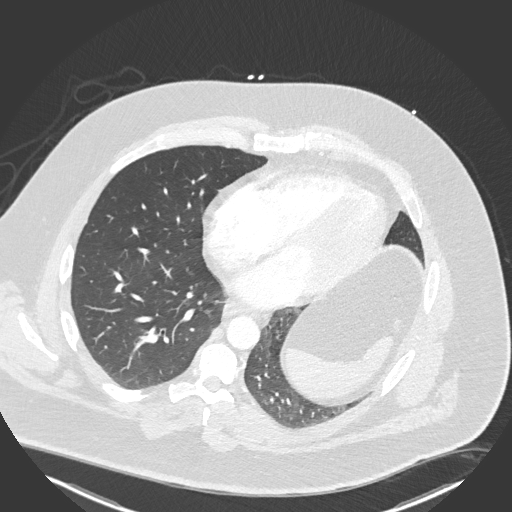
[im 184/368  soft-tissue]
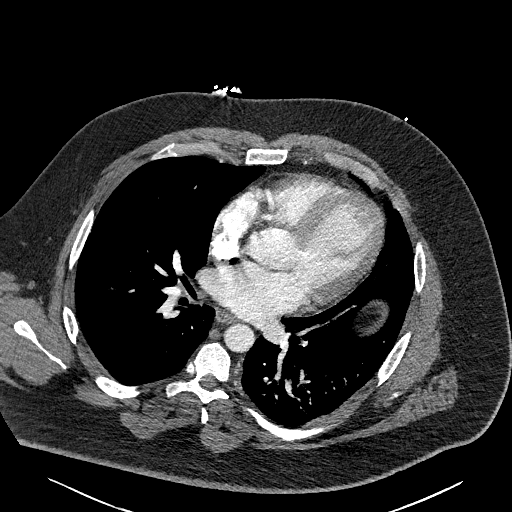
[im 215/368  lung]
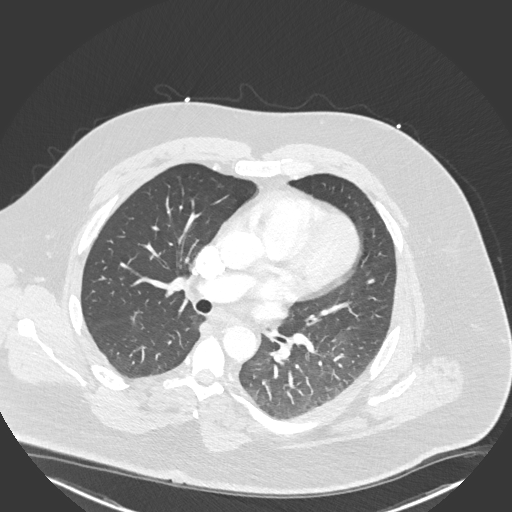
[im 230/368  soft-tissue]
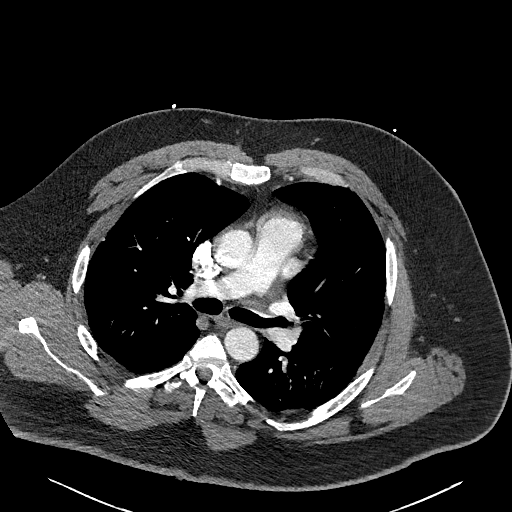
[im 260/368  lung]
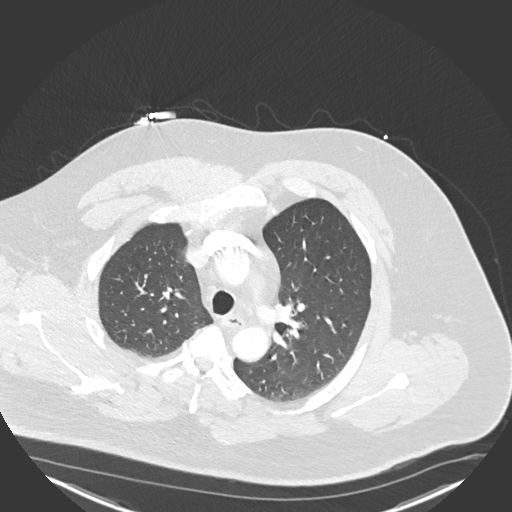
[im 276/368  soft-tissue]
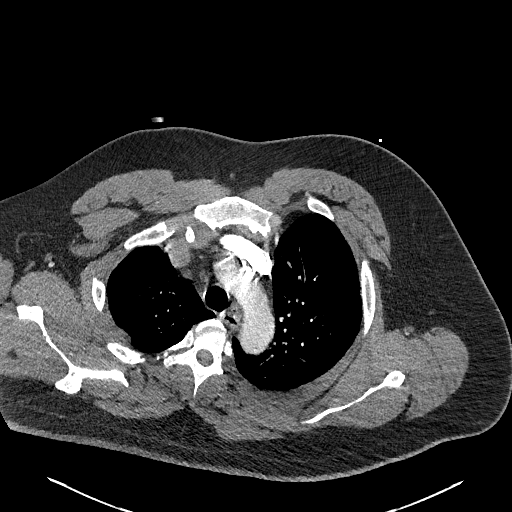
[im 306/368  lung]
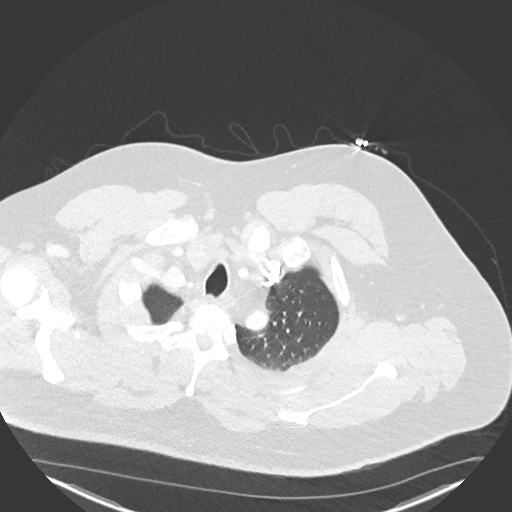
[im 322/368  soft-tissue]
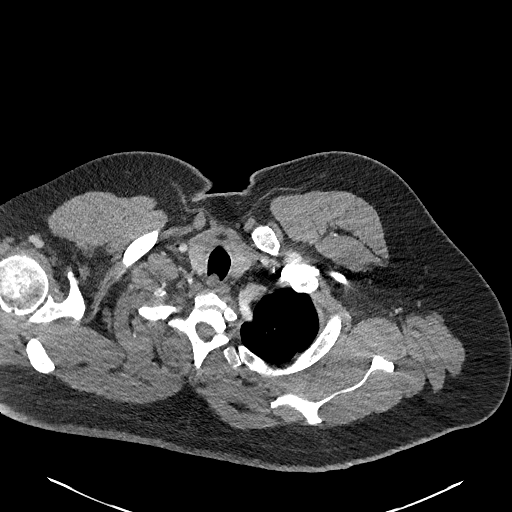
[im 352/368  lung]
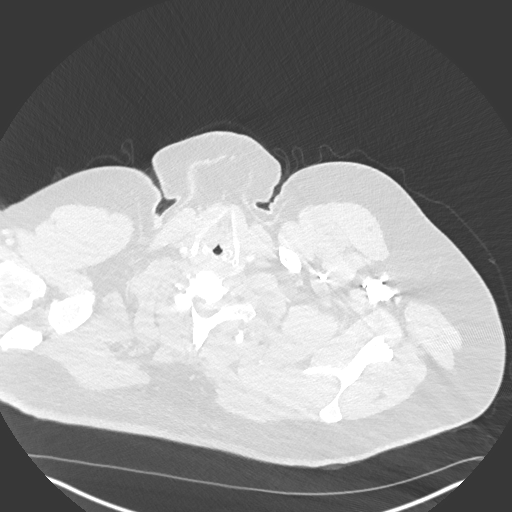

[Series 7: cor soft · coronal · 0.64mm/px · 3 of 191 slices shown]
[im 48/191  soft-tissue]
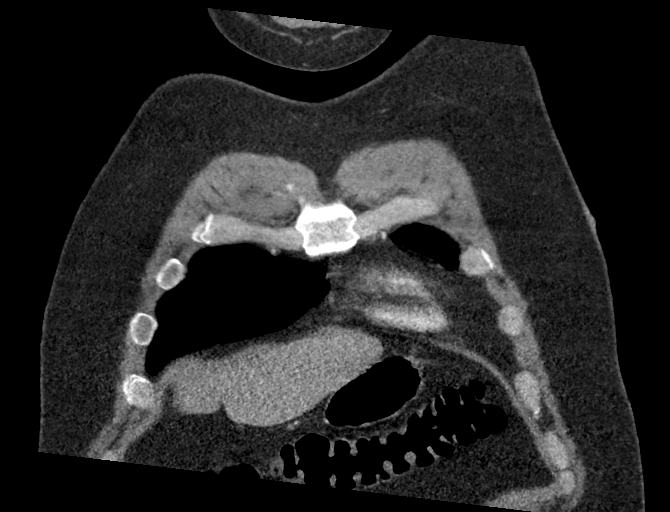
[im 96/191  soft-tissue]
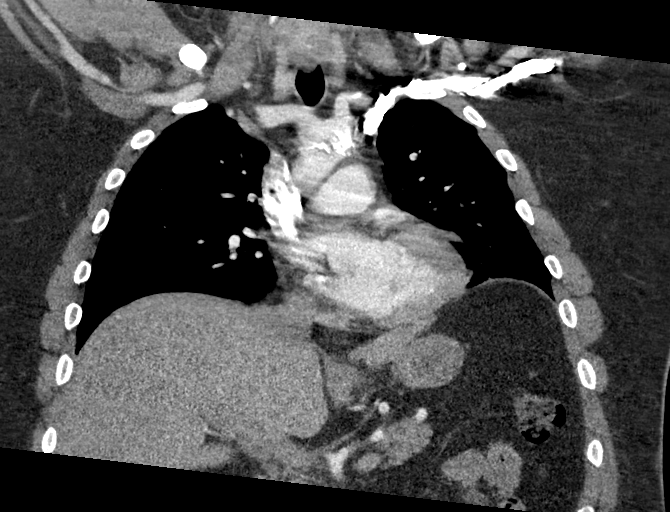
[im 143/191  soft-tissue]
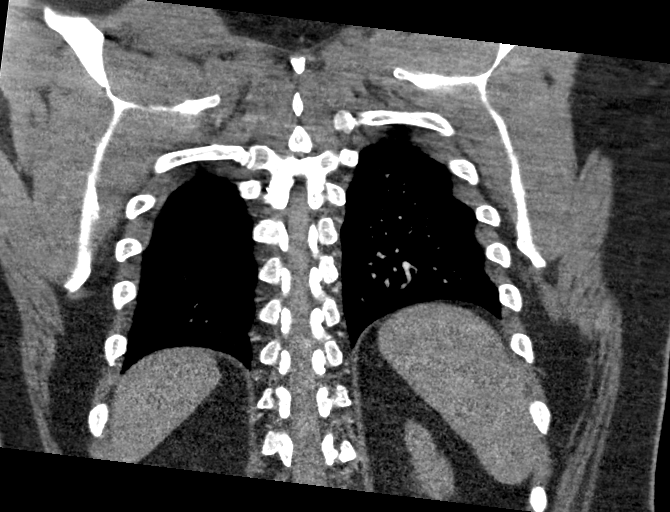

[18 of 46 positions shown; findings below may reference images not displayed]

FINDINGS: Cardiovascular: Satisfactory opacification of the pulmonary arteries
to the segmental level. No evidence of pulmonary embolism. Normal
heart size. No pericardial effusion.

Mediastinum/Nodes: No enlarged mediastinal, hilar, or axillary lymph
nodes. Thyroid gland, trachea, and esophagus demonstrate no
significant findings.

Lungs/Pleura: Lungs are clear. No pleural effusion or pneumothorax.

Upper Abdomen: No acute abnormality. Small nonobstructive left renal
calculus.

Musculoskeletal: No chest wall abnormality. No acute or significant
osseous findings.

Review of the MIP images confirms the above findings.
IMPRESSION: 1. Negative examination for pulmonary embolism.
2. Small nonobstructive left renal calculus.

## 2021-11-11 IMAGING — DX DG CHEST 1V PORT
1 series · 1 of 1 positions shown · non-contrast
Comparison: 11/12/2020

CLINICAL DATA: Questionable sepsis, tachycardia, fever, recent knee
surgery

EXAM:
PORTABLE CHEST 1 VIEW

[chest ap]
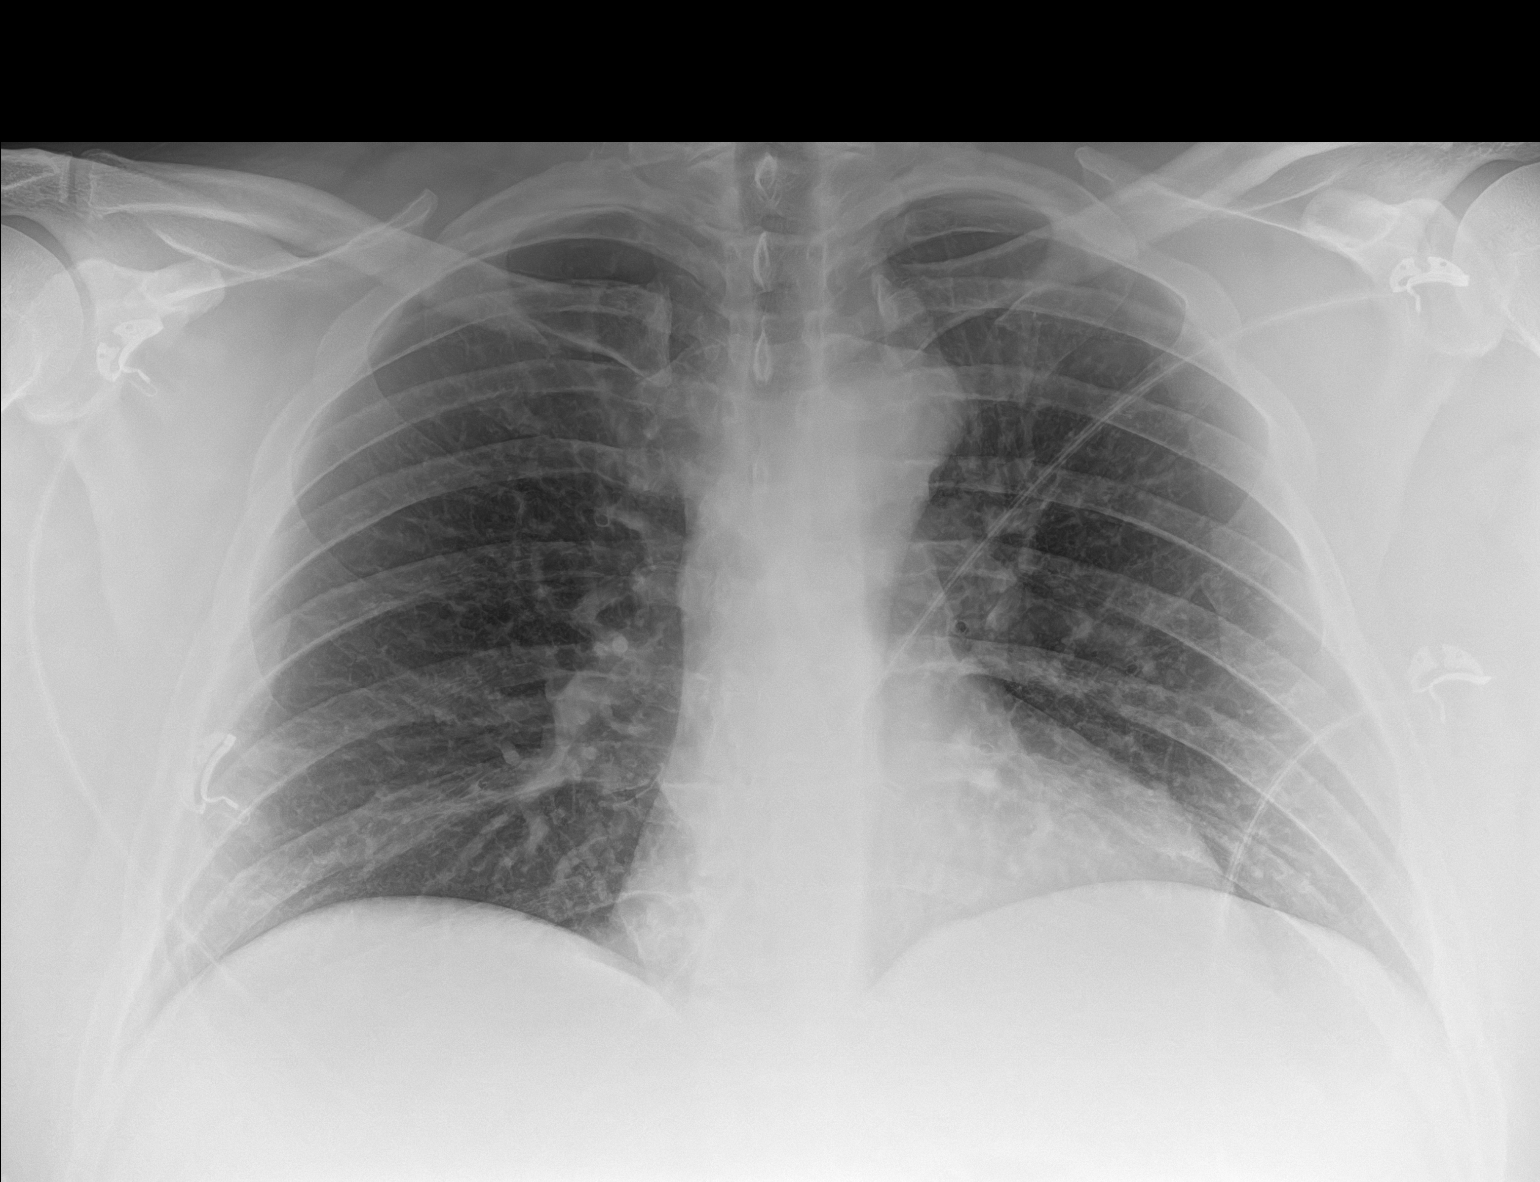

[1 of 1 positions shown; findings below may reference images not displayed]

FINDINGS: The heart size and mediastinal contours are within normal limits.
Both lungs are clear. The visualized skeletal structures are
unremarkable.
IMPRESSION: No acute abnormality of the lungs in AP portable projection.

## 2022-02-20 ENCOUNTER — Encounter: Payer: Self-pay | Admitting: Cardiology

## 2022-02-20 ENCOUNTER — Ambulatory Visit: Payer: 59 | Attending: Cardiology | Admitting: Cardiology

## 2022-02-20 VITALS — BP 116/78 | HR 64 | Ht 77.0 in | Wt 344.0 lb

## 2022-02-20 DIAGNOSIS — I480 Paroxysmal atrial fibrillation: Secondary | ICD-10-CM

## 2022-02-20 NOTE — Patient Instructions (Addendum)

## 2022-02-20 NOTE — Progress Notes (Signed)
Cardiology Office Note  Date: 02/20/2022   ID: [name] [name], DOB 12/22/1970, MRN [id number]  PCP:  [facility]  Cardiologist:  Nona Dell, MD Electrophysiologist:  None   Chief Complaint  Patient presents with   Cardiac follow-up    History of Present Illness: [name] [name] is a 51 y.o. male last seen in May.  He is here for a routine visit.  Reports no palpitations, stable NYHA class II dyspnea.  We went over his medications, he has been consistent with Toprol-XL and Cardizem CD, has not required any use of short acting Cardizem.  Remains on Eliquis for stroke prophylaxis without reported bleeding problems.  I reviewed his lab work from earlier in the year.  Past Medical History:  Diagnosis Date   Arthritis    GERD (gastroesophageal reflux disease)    Gout    History of kidney stones    History of pneumonia    Hypertension    Paroxysmal atrial fibrillation (HCC)    Type 2 diabetes mellitus (HCC)     Past Surgical History:  Procedure Laterality Date   CYSTOSCOPY/RETROGRADE/URETEROSCOPY/STONE EXTRACTION WITH BASKET Bilateral 08/29/2016   Procedure: CYSTOSCOPY/BILATERAL RETEROGRADE BILATERTAL URETEROSCOPY/STONE EXTRACTION WITH BASKET, RIGHT STENT PLACEMENT, HOLIUM LASER;  Surgeon: Bjorn Pippin, MD;  Location: [facility];  Service: Urology;  Laterality: Bilateral;   KNEE SURGERY     x 4   STONE EXTRACTION WITH BASKET     STRABISMUS SURGERY Right 04/02/2015   Procedure: REPAIR STRABISMUS RIGHT EYE;  Surgeon: Verne Carrow, MD;  Location: [facility];  Service: Ophthalmology;  Laterality: Right;   TOTAL KNEE ARTHROPLASTY Right 10/29/2020   Procedure: RIGHT TOTAL KNEE ARTHROPLASTY;  Surgeon: Kathryne Hitch, MD;  Location: [facility];  Service: Orthopedics;  Laterality: Right;    Current Outpatient Medications  Medication Sig Dispense Refill   acetaminophen (TYLENOL) 650 MG CR tablet Take 650 mg by mouth as needed for pain.      apixaban (ELIQUIS) 5 MG TABS tablet TAKE 1 TABLET BY MOUTH TWICE DAILY 60 tablet 10   atorvastatin (LIPITOR) 40 MG tablet Take 20 mg by mouth daily.     CONTOUR NEXT TEST test strip USE TO test TWICE DAILY     diltiazem (CARDIZEM CD) 120 MG 24 hr capsule Take 1 capsule (120 mg total) by mouth daily. 90 capsule 1   diltiazem (CARDIZEM) 30 MG tablet Take 1 tablet every 4 hours AS NEEDED for heart rate >100. 30 tablet 1   fluticasone (FLONASE) 50 MCG/ACT nasal spray Place 1 spray into both nostrils 2 (two) times daily as needed for allergies or rhinitis.     metFORMIN (GLUCOPHAGE) 500 MG tablet Take 500-1,000 mg by mouth in the morning and at bedtime. 1 in am and 2 at bedtime     metoprolol succinate (TOPROL-XL) 100 MG 24 hr tablet Take 1 tablet (100 mg total) by mouth daily. Take with or immediately following a meal. 30 tablet 2   No current facility-administered medications for this visit.   Allergies:  Penicillins   ROS: No syncope.  Physical Exam: VS:  BP 116/78   Pulse 64   Ht 6\' 5"  (1.956 m)   Wt (!) 344 lb (156 kg)   SpO2 97%   BMI 40.79 kg/m , BMI Body mass index is 40.79 kg/m.  Wt Readings from Last 3 Encounters:  02/20/22 (!) 344 lb (156 kg)  08/09/21 (!) 316 lb 12.8 oz (143.7 kg)  05/17/21 (!) 305  lb (138.3 kg)    General: Patient appears comfortable at rest. HEENT: Conjunctiva and lids normal. Neck: Supple, no elevated JVP or carotid bruits. Lungs: Clear to auscultation, nonlabored breathing at rest. Cardiac: Regular rate and rhythm, no S3 or significant systolic murmur. Extremities: No pitting edema.  ECG:  An ECG dated 05/17/2021 was personally reviewed today and demonstrated:  Sinus rhythm with R' in lead V1 and V2.  Recent Labwork:    Component Value Date/Time   CHOL 75 11/28/2020 0530   TRIG 76 11/28/2020 0530   HDL 20 (L) 11/28/2020 0530   CHOLHDL 3.8 11/28/2020 0530   VLDL 15 11/28/2020 0530   LDLCALC 40 11/28/2020 0530  February 2023: Hemoglobin 14.7,  platelets 260, BUN 19, creatinine 0.88, potassium 4.4, AST 14, ALT 11, cholesterol 99, triglycerides 55, HDL 39, LDL 47, hemoglobin 81C 6.3%  Other Studies Reviewed Today:  Cardiac monitor February 2023: Patch Wear Time:  6 days and 21 hours   Predominant underlying rhythm was sinus rhythm 4.4% ventricular ectopy Triggered events associated with sinus rhythm and sinus rhythm with PVCs  Echocardiogram 08/29/2021:  1. Left ventricular ejection fraction, by estimation, is 65 to 70%. The  left ventricle has normal function. The left ventricle has no regional  wall motion abnormalities. There is mild left ventricular hypertrophy.  Left ventricular diastolic parameters  are indeterminate. The average left ventricular global longitudinal strain  is -19.7 %. The global longitudinal strain is normal.   2. Right ventricular systolic function is normal. The right ventricular  size is normal. Tricuspid regurgitation signal is inadequate for assessing  PA pressure.   3. The mitral valve is normal in structure. Trivial mitral valve  regurgitation. No evidence of mitral stenosis.   4. The aortic valve is tricuspid. Aortic valve regurgitation is not  visualized. No aortic stenosis is present.   5. The inferior vena cava is normal in size with greater than 50%  respiratory variability, suggesting right atrial pressure of 3 mmHg.   Assessment and Plan:  1.  Paroxysmal atrial fibrillation with CHA2DS2-VASc score of 2.  He is symptomatically stable on Cardizem CD and Toprol-XL.  Continue Eliquis for stroke prophylaxis.  2.  Occasional frequent PVCs by history, no significant palpitations.  LVEF 65 to 70% by echocardiogram in May.  Continue with observation.  Medication Adjustments/Labs and Tests Ordered: Current medicines are reviewed at length with the patient today.  Concerns regarding medicines are outlined above.   Tests Ordered: No orders of the defined types were placed in this  encounter.   Medication Changes: No orders of the defined types were placed in this encounter.   Disposition:  Follow up  6 months.  Signed, Jonelle Sidle, MD, [facility] 02/20/2022 3:03 PM    [facility] at [facility] [address] [place], [place], [place] [address] Phone: [phone]; Fax: [phone]

## 2022-05-18 ENCOUNTER — Encounter (HOSPITAL_COMMUNITY): Payer: Self-pay | Admitting: *Deleted

## 2022-06-15 ENCOUNTER — Encounter: Payer: Self-pay | Admitting: Radiology

## 2022-07-25 ENCOUNTER — Other Ambulatory Visit: Payer: Self-pay | Admitting: *Deleted

## 2022-07-25 DIAGNOSIS — I480 Paroxysmal atrial fibrillation: Secondary | ICD-10-CM

## 2022-07-25 MED ORDER — APIXABAN 5 MG PO TABS: 5.0000 mg | ORAL_TABLET | Freq: Two times a day (BID) | ORAL | 5 refills | Status: DC

## 2022-07-25 NOTE — Telephone Encounter (Signed)
Prescription refill request for Eliquis received. Indication: PAF Last office visit: 02/20/22  Ival Bible MD Scr: 0.88 on 06/07/21  [facility] Age: 52 Weight: 156kg   Based on above findings Eliquis  twice daily is the appropriate dose.  Pt has upcoming appt with Dr Diona Browner 08/28/22.  Requested CBC/BMP be done at the time. Refill approved.

## 2022-07-25 NOTE — Telephone Encounter (Signed)
Pt had to r/s apt with Dr. Diona Browner - offered sooner apt with E. Philis Nettle but pt requested to see Diona Browner instead in August. Stated he just needed to keep getting his meds refilled till then.

## 2022-07-26 ENCOUNTER — Other Ambulatory Visit: Payer: Self-pay | Admitting: *Deleted

## 2022-07-26 ENCOUNTER — Encounter: Payer: Self-pay | Admitting: Cardiology

## 2022-07-26 DIAGNOSIS — I480 Paroxysmal atrial fibrillation: Secondary | ICD-10-CM

## 2022-07-26 MED ORDER — APIXABAN 5 MG PO TABS: 5.0000 mg | ORAL_TABLET | Freq: Two times a day (BID) | ORAL | 5 refills | Status: DC

## 2022-07-26 NOTE — Telephone Encounter (Signed)
Refill for Eliquis resent to to [facility] instead of local pharmacy.

## 2022-08-28 ENCOUNTER — Ambulatory Visit: Payer: 59 | Admitting: Cardiology

## 2022-09-19 ENCOUNTER — Other Ambulatory Visit (INDEPENDENT_AMBULATORY_CARE_PROVIDER_SITE_OTHER): Payer: Self-pay

## 2022-09-19 ENCOUNTER — Ambulatory Visit (INDEPENDENT_AMBULATORY_CARE_PROVIDER_SITE_OTHER): Payer: Self-pay | Admitting: Physician Assistant

## 2022-09-19 DIAGNOSIS — M25562 Pain in left knee: Secondary | ICD-10-CM

## 2022-09-19 DIAGNOSIS — G8929 Other chronic pain: Secondary | ICD-10-CM

## 2022-09-19 MED ORDER — TRAMADOL HCL 50 MG PO TABS: 50.0000 mg | ORAL_TABLET | Freq: Two times a day (BID) | ORAL | 2 refills | Status: DC | PRN

## 2022-09-19 NOTE — Progress Notes (Signed)
Office Visit Note   Patient: [name] [name]           Date of Birth: 02/11/1971           MRN: [id number] Visit Date: 09/19/2022              Requested by: [facility] [address] [place],  [place] [address] PCP: [facility]   Assessment & Plan: Visit Diagnoses:  1. Chronic pain of left knee     Plan: Impression is left knee arthritis flareup.  Today, we discussed various treatment options to include left knee aspiration and injection.  He is not interested in having an injection today but just an aspiration.  I was able to aspirate approximately 25 cc of serosanguineous fluid from the left knee.  He tolerated this well.  He will follow-up as needed.  Call with concerns or questions.  Follow-Up Instructions: Return if symptoms worsen or fail to improve.   Orders:  Orders Placed This Encounter  Procedures   XR KNEE 3 VIEW LEFT   Meds ordered this encounter  Medications   traMADol (ULTRAM) 50 MG tablet    Sig: Take 1 tablet (50 mg total) by mouth 2 (two) times daily as needed.    Dispense:  30 tablet    Refill:  2      Procedures: No procedures performed   Clinical Data: No additional findings.   Subjective: Chief Complaint  Patient presents with   Left Knee - Pain    HPI patient is a very pleasant 52 year old gentleman who comes in today with left knee pain.  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  He has had increased pain to the entire knee with occasional sharp shooting pains to the medial and lateral aspects.  Symptoms are worse when he is stair climbing as well as squatting or keeping his knees in a flexed position for a period of time.  He has been resting and using ice and Tylenol without significant relief.  He is on Eliquis for A-fib and is unable to take NSAIDs.  He tells me that following a previous cortisone injection to the  right knee, he developed severe hypertension.  Review of Systems as detailed in HPI.  All others reviewed and are negative   Objective: Vital Signs: There were no vitals taken for this visit.  Physical Exam well-developed well-nourished gentleman in no acute distress.  Alert and oriented x 3.  Ortho Exam left knee exam reveals moderate effusion.  Range of motion 0 to 95 degrees.  Medial and lateral joint line tenderness.    Specialty Comments:  No specialty comments available.  Imaging: XR KNEE 3 VIEW LEFT  Result Date: 09/19/2022 X-rays demonstrate moderate medial joint space narrowing    PMFS History: Patient Active Problem List   Diagnosis Date Noted   Joint pain 11/29/2020   Atrial fibrillation with RVR (HCC) 11/28/2020   A-fib (HCC) 11/28/2020   HLD (hyperlipidemia) 11/28/2020    Class: Chronic   DMII (diabetes mellitus, type 2) (HCC) 11/28/2020    Class: Chronic   Sinus tachycardia 11/12/2020   Dehydration 11/12/2020   Status post right knee replacement 10/29/2020   Anginal equivalent 09/14/2020   Morbid (severe) obesity due to excess calories (HCC) 12/18/2019   Essential (primary) hypertension 06/26/2019    Class: Chronic   DOE (  dyspnea on exertion) 06/26/2019   History of arthroscopy of knee 03/11/2018   Primary localized osteoarthrosis of right lower leg 02/07/2018   Meniscal cyst, right 11/08/2016   Past Medical History:  Diagnosis Date   Arthritis    GERD (gastroesophageal reflux disease)    Gout    History of kidney stones    History of pneumonia    Hypertension    Paroxysmal atrial fibrillation (HCC)    Type 2 diabetes mellitus (HCC)     Family History  Problem Relation Age of Onset   Hypertension Father    Cancer Father    Cancer Maternal Grandmother    Cancer Maternal Grandfather    Cancer Paternal Grandmother    Heart failure Paternal Grandmother     Past Surgical History:  Procedure Laterality Date    CYSTOSCOPY/RETROGRADE/URETEROSCOPY/STONE EXTRACTION WITH BASKET Bilateral 08/29/2016   Procedure: CYSTOSCOPY/BILATERAL RETEROGRADE BILATERTAL URETEROSCOPY/STONE EXTRACTION WITH BASKET, RIGHT STENT PLACEMENT, HOLIUM LASER;  Surgeon: Bjorn Pippin, MD;  Location: [facility];  Service: Urology;  Laterality: Bilateral;   KNEE SURGERY     x 4   STONE EXTRACTION WITH BASKET     STRABISMUS SURGERY Right 04/02/2015   Procedure: REPAIR STRABISMUS RIGHT EYE;  Surgeon: Verne Carrow, MD;  Location: [facility];  Service: Ophthalmology;  Laterality: Right;   TOTAL KNEE ARTHROPLASTY Right 10/29/2020   Procedure: RIGHT TOTAL KNEE ARTHROPLASTY;  Surgeon: Kathryne Hitch, MD;  Location: [facility];  Service: Orthopedics;  Laterality: Right;   Social History   Occupational History   Not on file  Tobacco Use   Smoking status: Never   Smokeless tobacco: Never  Vaping Use   Vaping Use: Never used  Substance and Sexual Activity   Alcohol use: No   Drug use: No   Sexual activity: Not on file

## 2022-11-08 ENCOUNTER — Other Ambulatory Visit: Payer: Self-pay

## 2022-11-08 ENCOUNTER — Ambulatory Visit: Payer: BLUE CROSS/BLUE SHIELD | Admitting: Orthopaedic Surgery

## 2022-11-08 DIAGNOSIS — G8929 Other chronic pain: Secondary | ICD-10-CM | POA: Diagnosis not present

## 2022-11-08 DIAGNOSIS — M25562 Pain in left knee: Secondary | ICD-10-CM

## 2022-11-08 NOTE — Progress Notes (Signed)
The patient is a 52 year old gentleman well-known to me.  We actually replaced his right knee in 2022 due to severe arthritis.  He has been having some issues with his left knee.  Has been having locking and catching with that knee and instability symptoms.  He did have x-rays of his knee a month ago and I was able to review these.  I reviewed these with him and his wife today as well.  His right total knee has done well.  He is a diabetic.  He is under better control with his diabetes but any type of steroid injections have increased his blood sugars significantly as well as his blood pressure.  At this point we need to avoid any type of steroid injections in that knee.  Examination of his left knee does show mild effusion.  There is slight varus malalignment of the knee.  There is medial and lateral joint line tenderness but a lot of posterior medial tenderness and there is actually a positive cMurray sign to the medial compartment of his knee.  There is patellofemoral crepitation but good range of motion.  The knee is ligamentously stable.  The x-rays of his left knee show mild to moderate arthritic changes with osteophytes in all 3 compartments but still the medial lateral compartments appear to have good space.  At this point we need to obtain an MRI of his left knee to assess the medial meniscus as well as to look at the cartilage on the weightbearing surfaces of his knee so we can come up with the best treatment plan for him.  He knows to continue to work on quad strengthening exercises and weight loss.  At his next appointment I would like a weight and BMI calculation as well as going over the MRI of his left knee.  He and his wife agree with this treatment plan.  All question concerns were answered and addressed.

## 2022-11-14 ENCOUNTER — Other Ambulatory Visit: Payer: Self-pay | Admitting: Physician Assistant

## 2022-11-23 ENCOUNTER — Encounter: Payer: Self-pay | Admitting: Cardiology

## 2022-11-23 ENCOUNTER — Ambulatory Visit: Payer: BLUE CROSS/BLUE SHIELD | Attending: Cardiology | Admitting: Cardiology

## 2022-11-23 VITALS — BP 128/84 | HR 53 | Ht 76.0 in | Wt 357.6 lb

## 2022-11-23 DIAGNOSIS — I493 Ventricular premature depolarization: Secondary | ICD-10-CM

## 2022-11-23 DIAGNOSIS — I480 Paroxysmal atrial fibrillation: Secondary | ICD-10-CM

## 2022-11-23 MED ORDER — DILTIAZEM HCL 30 MG PO TABS: ORAL_TABLET | ORAL | 1 refills | Status: AC

## 2022-11-23 NOTE — Addendum Note (Signed)
Addended by: Marlyn Corporal A on: 11/23/2022 04:51 PM   Modules accepted: Orders

## 2022-11-23 NOTE — Progress Notes (Signed)
    Cardiology Office Note  Date: 11/23/2022   ID: [name] [name], DOB Feb 18, 1971, MRN [id number]  History of Present Illness: [name] [name] is a 52 y.o. male last seen in November 2023.  He is here for a routine visit.  Reports only rare very brief palpitations in his upper chest, no associated dizziness or syncope.  No exertional chest pain or unusual shortness of breath.  He is working third shift at this time.  I reviewed his medications.  He remains on Eliquis without reported spontaneous bleeding problems.  Also on Cardizem CD and Toprol-XL with heart rate in the 50s at rest.  ECG today shows sinus bradycardia with R' in lead V1.  Physical Exam: VS:  BP 128/84   Pulse (!) 53   Ht 6\' 4"  (1.93 m)   Wt (!) 357 lb 9.6 oz (162.2 kg)   SpO2 97%   BMI 43.53 kg/m , BMI Body mass index is 43.53 kg/m.  Wt Readings from Last 3 Encounters:  11/23/22 (!) 357 lb 9.6 oz (162.2 kg)  02/20/22 (!) 344 lb (156 kg)  08/09/21 (!) 316 lb 12.8 oz (143.7 kg)    General: Patient appears comfortable at rest. HEENT: Conjunctiva and lids normal. Neck: Supple, no elevated JVP or carotid bruits. Lungs: Clear to auscultation, nonlabored breathing at rest. Cardiac: Regular rate and rhythm, no S3 or significant systolic murmur.. Extremities: No pitting edema.  ECG:  An ECG dated 05/17/2021 was personally reviewed today and demonstrated:  Sinus rhythm with R' in lead V1 and V2.  Labwork:  February 2023: Hemoglobin 14.7, platelets 260, BUN 19, creatinine 0.88, potassium 4.4, AST 14, ALT 11, cholesterol 99, triglycerides 55, HDL 39, LDL 47, hemoglobin A1c 6.3%  Other Studies Reviewed Today:  Cardiac monitor February 2023: Patch Wear Time:  6 days and 21 hours   Predominant underlying rhythm was sinus rhythm 4.4% ventricular ectopy Triggered events associated with sinus rhythm and sinus rhythm with PVCs   Echocardiogram 08/29/2021:  1. Left ventricular ejection fraction, by estimation, is 65 to  70%. The  left ventricle has normal function. The left ventricle has no regional  wall motion abnormalities. There is mild left ventricular hypertrophy.  Left ventricular diastolic parameters  are indeterminate. The average left ventricular global longitudinal strain  is -19.7 %. The global longitudinal strain is normal.   2. Right ventricular systolic function is normal. The right ventricular  size is normal. Tricuspid regurgitation signal is inadequate for assessing  PA pressure.   3. The mitral valve is normal in structure. Trivial mitral valve  regurgitation. No evidence of mitral stenosis.   4. The aortic valve is tricuspid. Aortic valve regurgitation is not  visualized. No aortic stenosis is present.   5. The inferior vena cava is normal in size with greater than 50%  respiratory variability, suggesting right atrial pressure of 3 mmHg.   Assessment and Plan:  1.  Paroxysmal atrial fibrillation with CHA2DS2-VASc score of 2.  He reports no increasing sense of palpitations, and is in sinus rhythm by ECG today.  Continue Eliquis for stroke prophylaxis, no spontaneous bleeding problems reported.  He follows lab work through [facility].  Continue combination of Cardizem CD and Toprol-XL.  2.  History of occasional to frequent PVCs.  Quiescent at this time.  Disposition:  Follow up  6 months.  Signed, Jonelle Sidle, M.D., F[facility] at [facility]

## 2022-11-23 NOTE — Patient Instructions (Signed)
Medication Instructions:  Your physician recommends that you continue on your current medications as directed. Please refer to the Current Medication list given to you today.   Labwork: None today  Testing/Procedures: None today  Follow-Up: 6 months  Any Other Special Instructions Will Be Listed Below (If Applicable).  If you need a refill on your cardiac medications before your next appointment, please call your pharmacy.  

## 2022-11-28 ENCOUNTER — Encounter: Payer: Self-pay | Admitting: Cardiology

## 2022-11-29 ENCOUNTER — Ambulatory Visit
Admission: RE | Admit: 2022-11-29 | Discharge: 2022-11-29 | Disposition: A | Discharge status: Home / Self Care | Payer: BLUE CROSS/BLUE SHIELD | Source: Ambulatory Visit | Attending: Orthopaedic Surgery | Admitting: Orthopaedic Surgery

## 2022-11-29 DIAGNOSIS — G8929 Other chronic pain: Secondary | ICD-10-CM

## 2022-12-06 ENCOUNTER — Ambulatory Visit: Payer: BLUE CROSS/BLUE SHIELD | Admitting: Orthopaedic Surgery

## 2022-12-06 DIAGNOSIS — M25562 Pain in left knee: Secondary | ICD-10-CM | POA: Diagnosis not present

## 2022-12-06 DIAGNOSIS — G8929 Other chronic pain: Secondary | ICD-10-CM | POA: Diagnosis not present

## 2022-12-06 DIAGNOSIS — M1712 Unilateral primary osteoarthritis, left knee: Secondary | ICD-10-CM | POA: Insufficient documentation

## 2022-12-06 NOTE — Progress Notes (Signed)
The patient is well-known to me.  He is 52 years old and about 2 years ago we replaced his right knee.  He is been having left knee pain but his x-ray showed what we felt was well-maintained joint space.  We eventually sent him for a MRI of the left knee after the failure of conservative treatment.  He is here to go over that today.  The MRI of his knee does show tricompartment arthritis mainly at the lateral compartment and medial compartments.  The ACL, PCL as well as both medial and lateral meniscus are intact and there is no meniscal tear.  This is mild to moderate tricompartment arthritis.  We talked in length in detail about treatment options.  At this point it is worth trying viscosupplementation with hyaluronic acid for that knee.  He agrees with this as well.  He does not feel like he is ready for knee replacement yet on that side.  We will see if we get him qualified for hyaluronic acid for the left knee and we will be in touch.  Once we hopefully get this approved and ordered.  This patient is diagnosed with osteoarthritis of the knee(s).    Radiographs show evidence of joint space narrowing, osteophytes, subchondral sclerosis and/or subchondral cysts.  This patient has knee pain which interferes with functional and activities of daily living.    This patient has experienced inadequate response, adverse effects and/or intolerance with conservative treatments such as acetaminophen, NSAIDS, topical creams, physical therapy or regular exercise, knee bracing and/or weight loss.   This patient has experienced inadequate response or has a contraindication to intra articular steroid injections for at least 3 months.   This patient is not scheduled to have a total knee replacement within 6 months of starting treatment with viscosupplementation.

## 2022-12-07 ENCOUNTER — Telehealth: Payer: Self-pay

## 2022-12-07 NOTE — Telephone Encounter (Signed)
VOB submitted for Monovisc, left knee 

## 2022-12-07 NOTE — Telephone Encounter (Signed)
Left knee gel injection ?

## 2022-12-20 ENCOUNTER — Telehealth: Payer: Self-pay

## 2022-12-20 NOTE — Telephone Encounter (Signed)
VOB submitted for Durolane, left knee due to Monovisc not being a preferred product through patient's insurance.

## 2022-12-27 ENCOUNTER — Telehealth: Payer: Self-pay

## 2022-12-27 NOTE — Telephone Encounter (Signed)
PA completed online through [employer] portal due to Euflexxa being a preferred product through patient's insurance.   PA Pending

## 2023-05-08 ENCOUNTER — Telehealth: Payer: Self-pay | Admitting: Cardiology

## 2023-05-08 NOTE — Telephone Encounter (Signed)
   Pre-operative Risk Assessment    Patient Name: [name] [name]  DOB: 04/13/1970 MRN: [id number]      Request for Surgical Clearance    Procedure:   Colonoscopy  Date of Surgery:  Clearance 06/22/23                                 Surgeon:  TBD Surgeon's Group or Practice Name:  [facility]  Phone number:  [phone]  Fax number:  [phone]   Type of Clearance Requested:   - Medical  - Pharmacy:  Hold Apixaban (Eliquis) 2 days prior   Type of Anesthesia:  Not Indicated   Additional requests/questions:  Please fax a copy of Clearance to the surgeon's office. Please advise pt on what medicine to hold and for how long!   Signed, Delaine Lame   05/08/2023, 2:38 PM

## 2023-05-12 NOTE — Telephone Encounter (Signed)
Patient with diagnosis of atrial fibrillation on Eliquis for anticoagulation.    Procedure:   Colonoscopy   Date of Surgery:  Clearance 06/22/23     CHA2DS2-VASc Score = 2   This indicates a 2.2% annual risk of stroke. The patient's score is based upon: CHF History: 0 HTN History: 1 Diabetes History: 1 Stroke History: 0 Vascular Disease History: 0 Age Score: 0 Gender Score: 0    CrCl >100 (with adjusted body weight) Platelet count 229  Per office protocol, patient can hold Eliquis for 2 days prior to procedure.   Patient will not need bridging with Lovenox (enoxaparin) around procedure.  **This guidance is not considered finalized until pre-operative APP has relayed final recommendations.**

## 2023-05-14 NOTE — Telephone Encounter (Signed)
1st attempt to reach pt regarding surgical clearance and the need for a tele visit.  Left a message for pt to call back and ask for the preop team. 

## 2023-05-14 NOTE — Telephone Encounter (Signed)
   Name: [name] [name]  DOB: 02-Jan-1971  MRN: [id number]  Primary Cardiologist: Nona Dell, MD   Preoperative team, please contact this patient and set up a phone call appointment for further preoperative risk assessment. Please obtain consent and complete medication review. Thank you for your help.  I confirm that guidance regarding antiplatelet and oral anticoagulation therapy has been completed and, if necessary, noted below.  Per office protocol, patient can hold Eliquis for 2 days prior to procedure.   Patient will not need bridging with Lovenox (enoxaparin) around procedure.  I also confirmed the patient resides in the state of [place]. As per [facility] Medical Board telemedicine laws, the patient must reside in the state in which the provider is licensed.   Ronney Asters, NP 05/14/2023, 9:29 AM [facility]

## 2023-05-14 NOTE — Telephone Encounter (Signed)
Tried calling patient again no answer left a vm to call back

## 2023-05-15 NOTE — Telephone Encounter (Signed)
 Left message to call back to schedule tele pre op appt.

## 2023-05-16 ENCOUNTER — Telehealth: Payer: Self-pay | Admitting: *Deleted

## 2023-05-16 NOTE — Telephone Encounter (Signed)
 Pt has been scheduled tele preop appt 06/08/23. Med rec and consent are done

## 2023-05-16 NOTE — Telephone Encounter (Signed)
 Pt has been scheduled tele preop appt 06/08/23. Med rec and consent are done.      Patient Consent for Virtual Visit        [name] [name] has provided verbal consent on 05/16/2023 for a virtual visit (video or telephone).   CONSENT FOR VIRTUAL VISIT FOR:  [name] [name]  By participating in this virtual visit I agree to the following:  I hereby voluntarily request, consent and authorize [facility] and its employed or contracted physicians, physician assistants, nurse practitioners or other licensed health care professionals (the Practitioner), to provide me with telemedicine health care services (the "Services) as deemed necessary by the treating Practitioner. I acknowledge and consent to receive the Services by the Practitioner via telemedicine. I understand that the telemedicine visit will involve communicating with the Practitioner through live audiovisual communication technology and the disclosure of certain medical information by electronic transmission. I acknowledge that I have been given the opportunity to request an in-person assessment or other available alternative prior to the telemedicine visit and am voluntarily participating in the telemedicine visit.  I understand that I have the right to withhold or withdraw my consent to the use of telemedicine in the course of my care at any time, without affecting my right to future care or treatment, and that the Practitioner or I may terminate the telemedicine visit at any time. I understand that I have the right to inspect all information obtained and/or recorded in the course of the telemedicine visit and may receive copies of available information for a reasonable fee.  I understand that some of the potential risks of receiving the Services via telemedicine include:  Delay or interruption in medical evaluation due to technological equipment failure or disruption; Information transmitted may not be sufficient (e.g. poor  resolution of images) to allow for appropriate medical decision making by the Practitioner; and/or  In rare instances, security protocols could fail, causing a breach of personal health information.  Furthermore, I acknowledge that it is my responsibility to provide information about my medical history, conditions and care that is complete and accurate to the best of my ability. I acknowledge that Practitioner's advice, recommendations, and/or decision may be based on factors not within their control, such as incomplete or inaccurate data provided by me or distortions of diagnostic images or specimens that may result from electronic transmissions. I understand that the practice of medicine is not an exact science and that Practitioner makes no warranties or guarantees regarding treatment outcomes. I acknowledge that a copy of this consent can be made available to me via my patient portal [facility] MyChart), or I can request a printed copy by calling the office of [facility].    I understand that my insurance will be billed for this visit.   I have read or had this consent read to me. I understand the contents of this consent, which adequately explains the benefits and risks of the Services being provided via telemedicine.  I have been provided ample opportunity to ask questions regarding this consent and the Services and have had my questions answered to my satisfaction. I give my informed consent for the services to be provided through the use of telemedicine in my medical care

## 2023-06-01 ENCOUNTER — Ambulatory Visit: Payer: BLUE CROSS/BLUE SHIELD | Admitting: Cardiology

## 2023-06-04 ENCOUNTER — Encounter: Payer: Self-pay | Admitting: Cardiology

## 2023-06-04 DIAGNOSIS — I480 Paroxysmal atrial fibrillation: Secondary | ICD-10-CM

## 2023-06-04 MED ORDER — APIXABAN 5 MG PO TABS: 5.0000 mg | ORAL_TABLET | Freq: Two times a day (BID) | ORAL | 5 refills | Status: DC

## 2023-06-04 NOTE — Telephone Encounter (Signed)
 Prescription refill request for Eliquis received. Indication: Afib  Last office visit: 11/23/22 Diona Browner)  Scr: 1.34 (02/13/23 via [facility])  Age: 53 Weight: 162.2kg  Appropriate dose. Refill sent.

## 2023-06-08 ENCOUNTER — Ambulatory Visit: Payer: BLUE CROSS/BLUE SHIELD | Attending: Internal Medicine | Admitting: Emergency Medicine

## 2023-06-08 DIAGNOSIS — Z01810 Encounter for preprocedural cardiovascular examination: Secondary | ICD-10-CM | POA: Diagnosis not present

## 2023-06-08 NOTE — Progress Notes (Signed)
 Virtual Visit via Telephone Note   Because of [name] [name] co-morbid illnesses, he is at least at moderate risk for complications without adequate follow up.  This format is felt to be most appropriate for this patient at this time.  Due to technical limitations with video connection (technology), today's appointment will be conducted as an audio only telehealth visit, and [name] [name] verbally agreed to proceed in this manner.   All issues noted in this document were discussed and addressed.  No physical exam could be performed with this format.  Evaluation Performed:  Preoperative cardiovascular risk assessment _____________   Date:  06/08/2023   Patient ID:  [name] [name], DOB 10-29-1970, MRN [id number] Patient Location:  Home Provider location:   Office  Primary Care Provider:  Practice, [facility] Primary Cardiologist:  [name] Dell, MD  Chief Complaint / Patient Profile   53 y.o. y/o male with a h/o paroxysmal atrial fibrillation, frequent PVCs who is pending colonoscopy on 06/22/2023 with [facility] surgical specialist and presents today for telephonic preoperative cardiovascular risk assessment.  History of Present Illness    [name] [name] is a 53 y.o. male who presents via audio/video conferencing for a telehealth visit today.  Pt was last seen in cardiology clinic on 11/23/2022 by [name] [name].  At that time [name] [name] was doing well.  The patient is now pending procedure as outlined above. Since his last visit, he denies chest pain, shortness of breath, lower extremity edema, fatigue, palpitations, presyncope, syncope, orthopnea, and PND.  Past Medical History    Past Medical History:  Diagnosis Date   Arthritis    GERD (gastroesophageal reflux disease)    Gout    History of kidney stones    History of pneumonia    Hypertension    Paroxysmal atrial fibrillation (HCC)    Type 2 diabetes mellitus (HCC)    Past Surgical History:  Procedure Laterality  Date   CYSTOSCOPY/RETROGRADE/URETEROSCOPY/STONE EXTRACTION WITH BASKET Bilateral 08/29/2016   Procedure: CYSTOSCOPY/BILATERAL RETEROGRADE BILATERTAL URETEROSCOPY/STONE EXTRACTION WITH BASKET, RIGHT STENT PLACEMENT, HOLIUM LASER;  Surgeon: [name] Pippin, MD;  Location: [facility];  Service: Urology;  Laterality: Bilateral;   KNEE SURGERY     x 4   STONE EXTRACTION WITH BASKET     STRABISMUS SURGERY Right 04/02/2015   Procedure: REPAIR STRABISMUS RIGHT EYE;  Surgeon: Verne Carrow, MD;  Location: [facility];  Service: Ophthalmology;  Laterality: Right;   TOTAL KNEE ARTHROPLASTY Right 10/29/2020   Procedure: RIGHT TOTAL KNEE ARTHROPLASTY;  Surgeon: Kathryne Hitch, MD;  Location: [facility];  Service: Orthopedics;  Laterality: Right;    Allergies  Allergies  Allergen Reactions   Penicillins Other (See Comments)    Has patient had a PCN reaction causing immediate rash, facial/tongue/throat swelling, SOB or lightheadedness with hypotension: Unknown Has patient had a PCN reaction causing severe rash involving mucus membranes or skin necrosis: Unknown Has patient had a PCN reaction that required hospitalization: Unknown Has patient had a PCN reaction occurring within the last 10 years: No Childhood reaction If all of the above answers are "NO", then may proceed with Cephalosporin use.     Home Medications    Prior to Admission medications   Medication Sig Start Date End Date Taking? Authorizing Provider  acetaminophen (TYLENOL) 650 MG CR tablet Take 650 mg by mouth as needed for pain.    [provider]  apixaban (ELIQUIS) 5 MG TABS tablet Take 1 tablet (5 mg total) by  mouth 2 (two) times daily. 06/04/23   [name] Sidle, MD  atorvastatin (LIPITOR) 40 MG tablet Take 20 mg by mouth daily.    [provider]  CONTOUR NEXT TEST test strip USE TO test TWICE DAILY 01/27/21   [provider]  diltiazem (CARDIZEM CD) 120 MG 24 hr capsule Take 1 capsule  (120 mg total) by mouth daily. 02/09/21   [name] Sidle, MD  diltiazem (CARDIZEM) 30 MG tablet Take 1 tablet every 4 hours AS NEEDED for heart rate >100. 11/23/22   [name] Sidle, MD  fluticasone [facility]) 50 MCG/ACT nasal spray Place 1 spray into both nostrils 2 (two) times daily as needed for allergies or rhinitis.    [provider]  metFORMIN (GLUCOPHAGE) 500 MG tablet Take 500-1,000 mg by mouth in the morning and at bedtime. 1 in am and 2 at bedtime    [provider]  metoprolol succinate (TOPROL-XL) 100 MG 24 hr tablet Take 1 tablet (100 mg total) by mouth daily. Take with or immediately following a meal. 11/30/20 05/16/23  Shahmehdi, Gemma Payor, MD  traMADol (ULTRAM) 50 MG tablet Take 1 tablet (50 mg total) by mouth 2 (two) times daily as needed. 09/19/22   Cristie Hem, PA-C    Physical Exam    Vital Signs:  [name] [name] does not have vital signs available for review today.  Given telephonic nature of communication, physical exam is limited. AAOx3. NAD. Normal affect.  Speech and respirations are unlabored.  Accessory Clinical Findings    None  Assessment & Plan    1.  Preoperative Cardiovascular Risk Assessment: According to the Revised Cardiac Risk Index (RCRI), his Perioperative Risk of Major Cardiac Event is (%): 0.4. His Functional Capacity in METs is: 7.99 according to the Duke Activity Status Index (DASI). Therefore, based on ACC/AHA guidelines, patient would be at acceptable risk for the planned procedure without further cardiovascular testing.    The patient was advised that if he develops new symptoms prior to surgery to contact our office to arrange for a follow-up visit, and he verbalized understanding.  Per office protocol, patient can hold Eliquis for 2 days prior to procedure.   Patient will not need bridging with Lovenox (enoxaparin) around procedure.  A copy of this note will be routed to requesting surgeon.  Time:   Today, I have  spent 6 minutes with the patient with telehealth technology discussing medical history, symptoms, and management plan.     Denyce Robert, NP  06/08/2023, 7:38 AM

## 2023-06-21 ENCOUNTER — Other Ambulatory Visit: Payer: Self-pay | Admitting: Cardiology

## 2023-06-21 DIAGNOSIS — I480 Paroxysmal atrial fibrillation: Secondary | ICD-10-CM

## 2023-06-21 NOTE — Telephone Encounter (Signed)
 Prescription refill request for Eliquis received. Indication: Afib  Last office visit: 06/08/23 Leota Jacobsen)  Scr: 1.34 (02/13/23 via [facility])  Age: 53 Weight: 162.2kg  Appropriate dose. Refill sent.

## 2023-08-03 ENCOUNTER — Encounter: Payer: Self-pay | Admitting: Cardiology

## 2023-08-03 ENCOUNTER — Ambulatory Visit: Payer: BLUE CROSS/BLUE SHIELD | Attending: Cardiology | Admitting: Cardiology

## 2023-08-03 ENCOUNTER — Encounter: Payer: Self-pay | Admitting: *Deleted

## 2023-08-03 VITALS — BP 128/86 | HR 72 | Ht 76.0 in | Wt 348.6 lb

## 2023-08-03 DIAGNOSIS — I480 Paroxysmal atrial fibrillation: Secondary | ICD-10-CM

## 2023-08-03 DIAGNOSIS — I493 Ventricular premature depolarization: Secondary | ICD-10-CM

## 2023-08-03 NOTE — Progress Notes (Signed)
    Cardiology Office Note  Date: 08/03/2023   ID: KENETH BORG, DOB Apr 30, 1970, MRN [id number]  History of Present Illness: [name] [name] is a 53 y.o. male last seen in August 2024.  He is here for a follow-up visit.  Reports no palpitations since last visit.  No exertional chest discomfort or unusual dyspnea on exertion.  Plans to see his PCP later today with concerns about a possible kidney stone.  We went over his medications.  He reports compliance with therapy, no obvious bleeding problems on Eliquis .  We are requesting his most recent lab work from [facility].  Physical Exam: VS:  BP 128/86   Pulse 72   Ht 6\' 4"  (1.93 m)   Wt (!) 348 lb 9.6 oz (158.1 kg)   SpO2 99%   BMI 42.43 kg/m , BMI Body mass index is 42.43 kg/m.  Wt Readings from Last 3 Encounters:  08/03/23 (!) 348 lb 9.6 oz (158.1 kg)  11/23/22 (!) 357 lb 9.6 oz (162.2 kg)  02/20/22 (!) 344 lb (156 kg)    General: Patient appears comfortable at rest. HEENT: Conjunctiva and lids normal. Neck: Supple, no elevated JVP or carotid bruits. Lungs: Clear to auscultation, nonlabored breathing at rest. Cardiac: RRR, no significant murmur or gallop.  ECG:  An ECG dated 11/23/2022 was personally reviewed today and demonstrated:  Sinus bradycardia with R' in lead V1.  Labwork:  February 2023: Hemoglobin 14.7, platelets 260, BUN 19, creatinine 0.88, potassium 4.4, AST 14, ALT 11, cholesterol 99, triglycerides 55, HDL 39, LDL 47, hemoglobin A1c 6.3%   Other Studies Reviewed Today:  No interval cardiac testing for review today.  Assessment and Plan:  1.  Paroxysmal atrial fibrillation with CHA2DS2-VASc score of 2.  Continues to do well with no recurring palpitations, heart rate regular today.  He continues on Eliquis  5 mg twice daily for stroke prophylaxis, no spontaneous bleeding problems.  Requesting interval lab work from [employer].  Continue Cardizem  CD 120 mg daily and Toprol -XL 100 mg daily.   2.  History of  occasional to frequent PVCs (4.4% by cardiac monitor in 2023).  LVEF 65 to 70% by echocardiogram in May 2023.  No syncope.  Disposition:  Follow up 1 year.  Signed, Gerard Knight, M.D., F.A.C.C. [facility] at [facility]

## 2023-08-03 NOTE — Patient Instructions (Addendum)

## 2023-08-30 ENCOUNTER — Other Ambulatory Visit: Payer: Self-pay | Admitting: Urology

## 2023-08-30 ENCOUNTER — Telehealth: Payer: Self-pay | Admitting: Cardiology

## 2023-08-30 NOTE — Telephone Encounter (Signed)
 Good Morning Dr. Londa Rival  We have received a surgical clearance request for Mr. Cleone Dad for upcoming cystoscopy with uteroscopy. They were seen recently in clinic on 08/03/2023. Can you please comment on surgical clearance and guidance on holding Eliquis  for 2 days prior to procedure. Please forward you guidance and recommendations to P CV DIV PREOP   Thanks, Charles Connor, NP

## 2023-08-30 NOTE — Telephone Encounter (Signed)
   Pre-operative Risk Assessment    Patient Name: [name] [name]  DOB: 06/24/1970 MRN: [id number]      Request for Surgical Clearance    Procedure:  Cystoscopy with left ureteroscopy stent placement   Date of Surgery:  Clearance 09/11/23                                 Surgeon:  Dr Larinda Plover Sherrine Dolly Surgeon's Group or Practice Name:  [facility]  Phone number:  [phone] Fax number:  [phone]   Type of Clearance Requested:   - Medical  - Pharmacy:  Hold Apixaban  (Eliquis ) 2 Days before   Type of Anesthesia:  General    Additional requests/questions:    Jennine Mohair   08/30/2023, 8:30 AM

## 2023-09-03 NOTE — Patient Instructions (Addendum)
 SURGICAL WAITING ROOM VISITATION Patients having surgery or a procedure may have no more than 2 support people in the waiting area - these visitors may rotate in the visitor waiting room.   If the patient needs to stay at the hospital during part of their recovery, the visitor guidelines for inpatient rooms apply.  PRE-OP VISITATION  Pre-op nurse will coordinate an appropriate time for 1 support person to accompany the patient in pre-op.  This support person may not rotate.  This visitor will be contacted when the time is appropriate for the visitor to come back in the pre-op area.  Please refer to the [facility] website for the visitor guidelines for Inpatients (after your surgery is over and you are in a regular room).  You are not required to quarantine at this time prior to your surgery. However, you must do this: Hand Hygiene often Do NOT share personal items Notify your provider if you are in close contact with someone who has COVID or you develop fever 100.4 or greater, new onset of sneezing, cough, sore throat, shortness of breath or body aches.  If you test positive for Covid or have been in contact with anyone that has tested positive in the last 10 days please notify you surgeon.    Your procedure is scheduled on:  TUESDAY  September 11, 2023  Report to [facility] Main Entrance: [address] entrance where the [employer] is available.   Report to admitting at: 07:15 AM  Call this number if you have any questions or problems the morning of surgery [phone]  DO NOT EAT OR DRINK ANYTHING AFTER MIDNIGHT THE NIGHT PRIOR TO YOUR SURGERY / PROCEDURE.   FOLLOW  ANY ADDITIONAL PRE OP INSTRUCTIONS YOU RECEIVED FROM YOUR SURGEON'S OFFICE!!!   Oral Hygiene is also important to reduce your risk of infection.        Remember - BRUSH YOUR TEETH THE MORNING OF SURGERY WITH YOUR REGULAR TOOTHPASTE  Do NOT smoke after Midnight the night before surgery.  STOP TAKING all Vitamins,  Herbs and supplements 1 week before your surgery.  METFORMIN - Day before, take as usual.  DAY OF SURGERY: DO NOT TAKE METFORMIN .   STOP TAKING ELIQUIS  48 Hours before surgery.  Last dose will taken on Saturday 09-08-2023  Take ONLY these medicines the morning of surgery with A SIP OF WATER : metoprolol , diltiazem  and Tylenol  if needed for pain.  ?????   If You have been diagnosed with Sleep Apnea - Bring CPAP mask and tubing day of surgery. We will provide you with a CPAP machine on the day of your surgery.                   You may not have any metal on your body including jewelry, and body piercing  Do not wear lotions, powders, cologne, or deodorant  Men may shave face and neck.  Contacts, Hearing Aids, dentures or bridgework may not be worn into surgery. DENTURES WILL BE REMOVED PRIOR TO SURGERY PLEASE DO NOT APPLY "Poly grip" OR ADHESIVES!!!  Patients discharged on the day of surgery will not be allowed to drive home.  Someone NEEDS to stay with you for the first 24 hours after anesthesia.  Do not bring your home medications to the hospital. The Pharmacy will dispense medications listed on your medication list to you during your admission in the Hospital.  Special Instructions: Bring a copy of your healthcare power of attorney and living will documents the  day of surgery, if you wish to have them scanned into your [facility] Records- EPIC  Please read over the following fact sheets you were given: IF YOU HAVE QUESTIONS ABOUT YOUR PRE-OP INSTRUCTIONS, PLEASE CALL [phone].   [facility] - Preparing for Surgery Before surgery, you can play an important role.  Because skin is not sterile, your skin needs to be as free of germs as possible.  You can reduce the number of germs on your skin by washing with CHG (chlorahexidine gluconate) soap before surgery.  CHG is an antiseptic cleaner which kills germs and bonds with the skin to continue killing germs even after  washing. Please DO NOT use if you have an allergy to CHG or antibacterial soaps.  If your skin becomes reddened/irritated stop using the CHG and inform your nurse when you arrive at Short Stay. Do not shave (including legs and underarms) for at least 48 hours prior to the first CHG shower.  You may shave your face/neck.  Please follow these instructions carefully:  1.  Shower with CHG Soap the night before surgery and the  morning of surgery.  2.  If you choose to wash your hair, wash your hair first as usual with your normal  shampoo.  3.  After you shampoo, rinse your hair and body thoroughly to remove the shampoo.                             4.  Use CHG as you would any other liquid soap.  You can apply chg directly to the skin and wash.  Gently with a scrungie or clean washcloth.  5.  Apply the CHG Soap to your body ONLY FROM THE NECK DOWN.   Do not use on face/ open                           Wound or open sores. Avoid contact with eyes, ears mouth and genitals (private parts).                       Wash face,  Genitals (private parts) with your normal soap.             6.  Wash thoroughly, paying special attention to the area where your  surgery  will be performed.  7.  Thoroughly rinse your body with warm water  from the neck down.  8.  DO NOT shower/wash with your normal soap after using and rinsing off the CHG Soap.            9.  Pat yourself dry with a clean towel.            10.  Wear clean pajamas.            11.  Place clean sheets on your bed the night of your first shower and do not  sleep with pets.  ON THE DAY OF SURGERY : Do not apply any lotions/deodorants the morning of surgery.  Please wear clean clothes to the hospital/surgery center.    FAILURE TO FOLLOW THESE INSTRUCTIONS MAY RESULT IN THE CANCELLATION OF YOUR SURGERY  PATIENT SIGNATURE_________________________________  NURSE  SIGNATURE__________________________________  ________________________________________________________________________

## 2023-09-03 NOTE — Telephone Encounter (Signed)
   Patient Name: COLIE FUGITT  DOB: [birth year] MRN: [id number]  Primary Cardiologist: Teddie Favre, MD  Chart reviewed as part of pre-operative protocol coverage. Given past medical history and time since last visit, based on ACC/AHA guidelines, TAQUAN BRALLEY is at acceptable risk for the planned procedure without further cardiovascular testing.   Per protocol patient can hold Eliquis  48 hours prior to procedure as requested.  The patient was advised that if he develops new symptoms prior to surgery to contact our office to arrange for a follow-up visit, and he verbalized understanding.  I will route this recommendation to the requesting party via Epic fax function and remove from pre-op pool.  Please call with questions.  Francene Ing, Retha Cast, NP 09/03/2023, 9:59 AM

## 2023-09-03 NOTE — Progress Notes (Signed)
 COVID Vaccine received:  []  No [x]  Yes Date of any COVID positive Test in last 90 days:  none  PCP - Hunt Magyar, PA, [facility] [phone] (Work)  [phone] (Fax)  Cardiologist - Teddie Favre MD, Charles Connor NP, Cardiac clearance in 09-03-23 Epic note  Chest x-ray - 11-14-2020  1v  Epic EKG - 11-23-2022  Epic  Stress Test -  ECHO - 08-29-2021  Epic Cardiac Cath -  CT Coronary Calcium  score:   Bowel Prep - [x]  No  []   Yes ______  Pacemaker / ICD device [x]  No []  Yes   Spinal Cord Stimulator:[x]  No []  Yes       History of Sleep Apnea? []  No [x]  Yes   CPAP used?- [x]  No []  Yes    Does the patient monitor blood sugar?   []  N/A   []  No [x]  Yes  Patient has: []  NO Hx DM   []  Pre-DM   []  DM1  [x]   DM2 Last A1c was:  6.3 on  05-2021    Does patient have a [employer] or Dexcom? [x]  No []  Yes   Fasting Blood Sugar Ranges-  Checks Blood Sugar _1 times a day  Blood Thinner / Instructions: Eliquis  last dose 09-08-23 Aspirin  Instructions:  none  ERAS Protocol Ordered: [x]  No  []  Yes Patient is to be NPO after:   MN    Dental hx: []  Dentures:  []  N/A      []  Bridge or Partial:                   [x]  Loose or Damaged teeth:  bottom teeth  Activity level: Able to walk up 2 flights of stairs without becoming significantly short of breath or having chest pain?  []  No   [x]    Yes  SOB  Anesthesia review: A.fib, tachycardia, HTN, GERD, DM2, s/p strabismus surgery right eye 2016  Patient denies shortness of breath, fever, cough and chest pain at PAT appointment.  Patient verbalized understanding and agreement to the Pre-Surgical Instructions that were given to them at this PAT appointment. Patient was also educated of the need to review these PAT instructions again prior to his surgery.I reviewed the appropriate phone numbers to call if they have any and questions or concerns.

## 2023-09-05 ENCOUNTER — Encounter (HOSPITAL_COMMUNITY): Payer: Self-pay

## 2023-09-05 ENCOUNTER — Encounter (HOSPITAL_COMMUNITY)
Admission: RE | Admit: 2023-09-05 | Discharge: 2023-09-05 | Disposition: A | Discharge status: Home / Self Care | Source: Ambulatory Visit | Attending: Urology | Admitting: Urology

## 2023-09-05 ENCOUNTER — Other Ambulatory Visit: Payer: Self-pay

## 2023-09-05 VITALS — BP 138/78 | HR 54 | Temp 98.4°F | Resp 20 | Ht 76.0 in | Wt 339.0 lb

## 2023-09-05 DIAGNOSIS — Z7901 Long term (current) use of anticoagulants: Secondary | ICD-10-CM | POA: Insufficient documentation

## 2023-09-05 DIAGNOSIS — E119 Type 2 diabetes mellitus without complications: Secondary | ICD-10-CM | POA: Insufficient documentation

## 2023-09-05 DIAGNOSIS — I10 Essential (primary) hypertension: Secondary | ICD-10-CM

## 2023-09-05 DIAGNOSIS — N202 Calculus of kidney with calculus of ureter: Secondary | ICD-10-CM | POA: Insufficient documentation

## 2023-09-05 DIAGNOSIS — Z01818 Encounter for other preprocedural examination: Secondary | ICD-10-CM | POA: Diagnosis present

## 2023-09-05 DIAGNOSIS — I480 Paroxysmal atrial fibrillation: Secondary | ICD-10-CM | POA: Diagnosis not present

## 2023-09-05 DIAGNOSIS — I4891 Unspecified atrial fibrillation: Secondary | ICD-10-CM

## 2023-09-05 HISTORY — DX: Sleep apnea, unspecified: G47.30

## 2023-09-05 HISTORY — DX: Cardiac arrhythmia, unspecified: I49.9

## 2023-09-05 LAB — CBC
HCT: 41.8 % (ref 39.0–52.0)
Hemoglobin: 13.8 g/dL (ref 13.0–17.0)
MCH: 29.2 pg (ref 26.0–34.0)
MCHC: 33 g/dL (ref 30.0–36.0)
MCV: 88.6 fL (ref 80.0–100.0)
Platelets: 258 10*3/uL (ref 150–400)
RBC: 4.72 MIL/uL (ref 4.22–5.81)
RDW: 12.1 % (ref 11.5–15.5)
WBC: 6.5 10*3/uL (ref 4.0–10.5)
nRBC: 0 % (ref 0.0–0.2)

## 2023-09-05 LAB — BASIC METABOLIC PANEL WITH GFR
Anion gap: 8 (ref 5–15)
BUN: 17 mg/dL (ref 6–20)
CO2: 27 mmol/L (ref 22–32)
Calcium: 9.1 mg/dL (ref 8.9–10.3)
Chloride: 102 mmol/L (ref 98–111)
Creatinine, Ser: 1.27 mg/dL — ABNORMAL HIGH (ref 0.61–1.24)
GFR, Estimated: >60 mL/min (ref >60)
Glucose, Bld: 167 mg/dL — ABNORMAL HIGH (ref 70–99)
Potassium: 4.1 mmol/L (ref 3.5–5.1)
Sodium: 137 mmol/L (ref 135–145)

## 2023-09-05 LAB — HEMOGLOBIN A1C
Hgb A1c MFr Bld: 10 % — ABNORMAL HIGH (ref 4.8–5.6)
Mean Plasma Glucose: 240.3 mg/dL

## 2023-09-05 LAB — GLUCOSE, CAPILLARY: Glucose-Capillary: 156 mg/dL — ABNORMAL HIGH (ref 70–99)

## 2023-09-06 NOTE — Progress Notes (Signed)
 Anesthesia Chart Review   Case: [id number] Date/Time: 09/11/23 0915   Procedure: CYSTOSCOPY/URETEROSCOPY/HOLMIUM LASER/STENT PLACEMENT (Left) - CYSTOSCOPY/LEFT URETEROSCOPY/HOLMIUM LASER/STENT PLACEMENT/ RETROGRADE PYELOGRAM   Anesthesia type: General   Diagnosis: Calculus of kidney with calculus of ureter [N20.2]   Pre-op diagnosis: LEFT URETERAL AND RENAL STONES   Location: [facility]OM 03 / [facility]   Surgeons: Adelbert Homans, MD       DISCUSSION:53 y.o. never smoker with h/o HTN, sleep apnea, paroxysmal a-fib, DM II, left ureteral and renal stones scheduled for above procedure 09/11/2023 with Dr. Veryl Gottron Sherrine Dolly.   Last seen by cardiology 08/03/2023. Stable at this visit with 1 year follow up recommended.   Per cardiology preoperative evaluation 09/03/2023, "Chart reviewed as part of pre-operative protocol coverage. Given past medical history and time since last visit, based on ACC/AHA guidelines, [name] [name] is at acceptable risk for the planned procedure without further cardiovascular testing.    Per protocol patient can hold Eliquis  48 hours prior to procedure as requested."  VS: BP 138/78 Comment: right arm sitting  Pulse (!) 54   Temp 36.9 C   Resp 20   Ht 6\' 4"  (1.93 m)   Wt (!) 153.8 kg   SpO2 100%   BMI 41.26 kg/m   PROVIDERS: Practice, [facility]  Cardiologist - Teddie Favre MD  LABS: Labs reviewed: Acceptable for surgery. (all labs ordered are listed, but only abnormal results are displayed)  Labs Reviewed  HEMOGLOBIN A1C - Abnormal; Notable for the following components:      Result Value   Hgb A1c MFr Bld 10.0 (*)    All other components within normal limits  BASIC METABOLIC PANEL WITH GFR - Abnormal; Notable for the following components:   Glucose, Bld 167 (*)    Creatinine, Ser 1.27 (*)    All other components within normal limits  GLUCOSE, CAPILLARY - Abnormal; Notable for the following components:   Glucose-Capillary 156 (*)    All  other components within normal limits  CBC     IMAGES:   EKG:   CV: Echo 08/29/2021 1. Left ventricular ejection fraction, by estimation, is 65 to 70%. The  left ventricle has normal function. The left ventricle has no regional  wall motion abnormalities. There is mild left ventricular hypertrophy.  Left ventricular diastolic parameters  are indeterminate. The average left ventricular global longitudinal strain  is -19.7 %. The global longitudinal strain is normal.   2. Right ventricular systolic function is normal. The right ventricular  size is normal. Tricuspid regurgitation signal is inadequate for assessing  PA pressure.   3. The mitral valve is normal in structure. Trivial mitral valve  regurgitation. No evidence of mitral stenosis.   4. The aortic valve is tricuspid. Aortic valve regurgitation is not  visualized. No aortic stenosis is present.   5. The inferior vena cava is normal in size with greater than 50%  respiratory variability, suggesting right atrial pressure of 3 mmHg.   Comparison(s): Echocardiogram done 11/09/20 showed an EF of 55-60%.     Past Medical History:  Diagnosis Date   Arthritis    Dysrhythmia    A.fib   GERD (gastroesophageal reflux disease)    Gout    History of kidney stones    History of pneumonia    Hypertension    Paroxysmal atrial fibrillation (HCC)    Pneumonia    Sleep apnea    no CPAP   Type 2 diabetes mellitus (HCC)  Past Surgical History:  Procedure Laterality Date   CYSTOSCOPY/RETROGRADE/URETEROSCOPY/STONE EXTRACTION WITH BASKET Bilateral 08/29/2016   Procedure: CYSTOSCOPY/BILATERAL RETEROGRADE BILATERTAL URETEROSCOPY/STONE EXTRACTION WITH BASKET, RIGHT STENT PLACEMENT, HOLIUM LASER;  Surgeon: Homero Luster, MD;  Location: [facility];  Service: Urology;  Laterality: Bilateral;   KNEE SURGERY Right    x 5   STONE EXTRACTION WITH BASKET     STRABISMUS SURGERY Right 04/02/2015   Procedure: REPAIR STRABISMUS RIGHT EYE;  Surgeon:  Dorothey Gate, MD;  Location: [facility];  Service: Ophthalmology;  Laterality: Right;   TOTAL KNEE ARTHROPLASTY Right 10/29/2020   Procedure: RIGHT TOTAL KNEE ARTHROPLASTY;  Surgeon: Arnie Lao, MD;  Location: [facility];  Service: Orthopedics;  Laterality: Right;    MEDICATIONS:  acetaminophen  (TYLENOL ) 650 MG CR tablet   apixaban  (ELIQUIS ) 5 MG TABS tablet   atorvastatin  (LIPITOR) 20 MG tablet   CONTOUR NEXT TEST test strip   diltiazem  (CARDIZEM  CD) 120 MG 24 hr capsule   diltiazem  (CARDIZEM ) 30 MG tablet   fluticasone (FLONASE) 50 MCG/ACT nasal spray   HYDROcodone -acetaminophen  (NORCO/VICODIN) 5-325 MG tablet   metFORMIN  (GLUCOPHAGE -XR) 500 MG 24 hr tablet   metoprolol  succinate (TOPROL -XL) 100 MG 24 hr tablet   No current facility-administered medications for this encounter.    Chick Cotton Ward, PA-C [facility] [phone]

## 2023-09-10 NOTE — Anesthesia Preprocedure Evaluation (Addendum)
 Anesthesia Evaluation  Patient identified by MRN, date of birth, ID band Patient awake    Reviewed: Allergy & Precautions, NPO status , Patient's Chart, lab work & pertinent test results  History of Anesthesia Complications Negative for: history of anesthetic complications  Airway Mallampati: III  TM Distance: >3 FB Neck ROM: Full   Comment: Previous grade I view with MAC 4, easy mask Dental  (+) Dental Advisory Given, Missing, Poor Dentition   Pulmonary neg shortness of breath, sleep apnea (intermittent CPAP use) , neg COPD, neg recent URI   Pulmonary exam normal breath sounds clear to auscultation       Cardiovascular hypertension (metoprolol ), Pt. on home beta blockers (-) angina (-) Past MI, (-) Cardiac Stents and (-) CABG + dysrhythmias Atrial Fibrillation  Rhythm:Regular Rate:Normal  HLD  TTE 08/29/2021: IMPRESSIONS    1. Left ventricular ejection fraction, by estimation, is 65 to 70%. The  left ventricle has normal function. The left ventricle has no regional  wall motion abnormalities. There is mild left ventricular hypertrophy.  Left ventricular diastolic parameters  are indeterminate. The average left ventricular global longitudinal strain  is -19.7 %. The global longitudinal strain is normal.   2. Right ventricular systolic function is normal. The right ventricular  size is normal. Tricuspid regurgitation signal is inadequate for assessing  PA pressure.   3. The mitral valve is normal in structure. Trivial mitral valve  regurgitation. No evidence of mitral stenosis.   4. The aortic valve is tricuspid. Aortic valve regurgitation is not  visualized. No aortic stenosis is present.   5. The inferior vena cava is normal in size with greater than 50%  respiratory variability, suggesting right atrial pressure of 3 mmHg.     Neuro/Psych negative neurological ROS     GI/Hepatic Neg liver ROS,GERD  ,,  Endo/Other   diabetes (Hgb A1c 10.0), Poorly Controlled, Type 2, Oral Hypoglycemic Agents  Class 3 obesity  Renal/GU Renal disease (stones)     Musculoskeletal  (+) Arthritis , Osteoarthritis,    Abdominal  (+) + obese  Peds  Hematology negative hematology ROS (+) Lab Results      Component                Value               Date                      WBC                      6.5                 09/05/2023                HGB                      13.8                09/05/2023                HCT                      41.8                09/05/2023                MCV  88.6                09/05/2023                PLT                      258                 09/05/2023              Anesthesia Other Findings Last Eliquis : 09/11/2023  Reports reflux this morning  Reproductive/Obstetrics                             Anesthesia Physical Anesthesia Plan  ASA: 3  Anesthesia Plan: General   Post-op Pain Management: Tylenol  PO (pre-op)*   Induction: Intravenous and Rapid sequence  PONV Risk Score and Plan: 2 and Ondansetron , Dexamethasone  and Treatment may vary due to age or medical condition  Airway Management Planned: Oral ETT  Additional Equipment:   Intra-op Plan:   Post-operative Plan: Extubation in OR  Informed Consent: I have reviewed the patients History and Physical, chart, labs and discussed the procedure including the risks, benefits and alternatives for the proposed anesthesia with the patient or authorized representative who has indicated his/her understanding and acceptance.     Dental advisory given  Plan Discussed with: CRNA and Anesthesiologist  Anesthesia Plan Comments: (Risks of general anesthesia discussed including, but not limited to, sore throat, hoarse voice, chipped/damaged teeth, injury to vocal cords, nausea and vomiting, allergic reactions, lung infection, heart attack, stroke, and death. All questions answered. )        Anesthesia Quick Evaluation

## 2023-09-11 ENCOUNTER — Ambulatory Visit (HOSPITAL_COMMUNITY)
Admission: RE | Admit: 2023-09-11 | Discharge: 2023-09-11 | Disposition: A | Discharge status: Home / Self Care | Attending: Urology | Admitting: Urology

## 2023-09-11 ENCOUNTER — Encounter (HOSPITAL_COMMUNITY)
Admission: RE | Disposition: A | Discharge status: Home / Self Care | Payer: Self-pay | Source: Home / Self Care | Attending: Urology

## 2023-09-11 ENCOUNTER — Encounter (HOSPITAL_COMMUNITY): Payer: Self-pay | Admitting: Urology

## 2023-09-11 ENCOUNTER — Ambulatory Visit (HOSPITAL_COMMUNITY): Admitting: Anesthesiology

## 2023-09-11 ENCOUNTER — Ambulatory Visit (HOSPITAL_COMMUNITY): Payer: Self-pay | Admitting: Physician Assistant

## 2023-09-11 ENCOUNTER — Ambulatory Visit (HOSPITAL_COMMUNITY)

## 2023-09-11 ENCOUNTER — Other Ambulatory Visit: Payer: Self-pay

## 2023-09-11 DIAGNOSIS — Z6841 Body Mass Index (BMI) 40.0 and over, adult: Secondary | ICD-10-CM | POA: Insufficient documentation

## 2023-09-11 DIAGNOSIS — E66813 Obesity, class 3: Secondary | ICD-10-CM | POA: Diagnosis not present

## 2023-09-11 DIAGNOSIS — E1165 Type 2 diabetes mellitus with hyperglycemia: Secondary | ICD-10-CM | POA: Insufficient documentation

## 2023-09-11 DIAGNOSIS — Z79899 Other long term (current) drug therapy: Secondary | ICD-10-CM | POA: Diagnosis not present

## 2023-09-11 DIAGNOSIS — E119 Type 2 diabetes mellitus without complications: Secondary | ICD-10-CM

## 2023-09-11 DIAGNOSIS — M1990 Unspecified osteoarthritis, unspecified site: Secondary | ICD-10-CM | POA: Insufficient documentation

## 2023-09-11 DIAGNOSIS — Z8249 Family history of ischemic heart disease and other diseases of the circulatory system: Secondary | ICD-10-CM | POA: Diagnosis not present

## 2023-09-11 DIAGNOSIS — I4891 Unspecified atrial fibrillation: Secondary | ICD-10-CM | POA: Insufficient documentation

## 2023-09-11 DIAGNOSIS — K219 Gastro-esophageal reflux disease without esophagitis: Secondary | ICD-10-CM | POA: Insufficient documentation

## 2023-09-11 DIAGNOSIS — I119 Hypertensive heart disease without heart failure: Secondary | ICD-10-CM | POA: Diagnosis not present

## 2023-09-11 DIAGNOSIS — G4730 Sleep apnea, unspecified: Secondary | ICD-10-CM | POA: Insufficient documentation

## 2023-09-11 DIAGNOSIS — Z7984 Long term (current) use of oral hypoglycemic drugs: Secondary | ICD-10-CM | POA: Diagnosis not present

## 2023-09-11 DIAGNOSIS — Z7901 Long term (current) use of anticoagulants: Secondary | ICD-10-CM | POA: Diagnosis not present

## 2023-09-11 DIAGNOSIS — N202 Calculus of kidney with calculus of ureter: Secondary | ICD-10-CM | POA: Diagnosis present

## 2023-09-11 DIAGNOSIS — Z01818 Encounter for other preprocedural examination: Secondary | ICD-10-CM

## 2023-09-11 DIAGNOSIS — E785 Hyperlipidemia, unspecified: Secondary | ICD-10-CM | POA: Diagnosis not present

## 2023-09-11 HISTORY — PX: CYSTOSCOPY/URETEROSCOPY/HOLMIUM LASER/STENT PLACEMENT: SHX6546

## 2023-09-11 LAB — GLUCOSE, CAPILLARY
Glucose-Capillary: 154 mg/dL — ABNORMAL HIGH (ref 70–99)
Glucose-Capillary: 160 mg/dL — ABNORMAL HIGH (ref 70–99)

## 2023-09-11 SURGERY — CYSTOSCOPY/URETEROSCOPY/HOLMIUM LASER/STENT PLACEMENT
Anesthesia: General | Site: Pelvis | Laterality: Left

## 2023-09-11 MED ORDER — ONDANSETRON HCL 4 MG/2ML IJ SOLN
INTRAMUSCULAR | Status: AC
  Filled 2023-09-11: qty 2

## 2023-09-11 MED ORDER — MIDAZOLAM HCL 2 MG/2ML IJ SOLN
INTRAMUSCULAR | Status: DC | PRN
  Administered 2023-09-11: 2 mg via INTRAVENOUS

## 2023-09-11 MED ORDER — SUCCINYLCHOLINE CHLORIDE 200 MG/10ML IV SOSY
PREFILLED_SYRINGE | INTRAVENOUS | Status: DC | PRN
  Administered 2023-09-11: 160 mg via INTRAVENOUS

## 2023-09-11 MED ORDER — ORAL CARE MOUTH RINSE: 15.0000 mL | Freq: Once | OROMUCOSAL | Status: AC

## 2023-09-11 MED ORDER — CIPROFLOXACIN IN D5W 400 MG/200ML IV SOLN
400.0000 mg | INTRAVENOUS | Status: AC
  Administered 2023-09-11: 400 mg via INTRAVENOUS
  Filled 2023-09-11: qty 200

## 2023-09-11 MED ORDER — SODIUM CHLORIDE 0.9 % IR SOLN
Status: DC | PRN
  Administered 2023-09-11: 3000 mL via INTRAVESICAL

## 2023-09-11 MED ORDER — OXYCODONE HCL 5 MG/5ML PO SOLN: 5.0000 mg | Freq: Once | ORAL | Status: DC | PRN

## 2023-09-11 MED ORDER — PROPOFOL 10 MG/ML IV BOLUS
INTRAVENOUS | Status: AC
  Filled 2023-09-11: qty 20

## 2023-09-11 MED ORDER — FENTANYL CITRATE (PF) 100 MCG/2ML IJ SOLN
INTRAMUSCULAR | Status: DC | PRN
  Administered 2023-09-11: 100 ug via INTRAVENOUS

## 2023-09-11 MED ORDER — AMISULPRIDE (ANTIEMETIC) 5 MG/2ML IV SOLN: 10.0000 mg | Freq: Once | INTRAVENOUS | Status: DC | PRN

## 2023-09-11 MED ORDER — PHENAZOPYRIDINE HCL 200 MG PO TABS: 200.0000 mg | ORAL_TABLET | Freq: Three times a day (TID) | ORAL | 0 refills | Status: AC | PRN

## 2023-09-11 MED ORDER — PROPOFOL 10 MG/ML IV BOLUS
INTRAVENOUS | Status: DC | PRN
  Administered 2023-09-11: 250 mg via INTRAVENOUS

## 2023-09-11 MED ORDER — CIPROFLOXACIN HCL 500 MG PO TABS: 500.0000 mg | ORAL_TABLET | Freq: Two times a day (BID) | ORAL | 0 refills | Status: AC

## 2023-09-11 MED ORDER — ACETAMINOPHEN 500 MG PO TABS
1000.0000 mg | ORAL_TABLET | Freq: Once | ORAL | Status: AC
  Administered 2023-09-11: 1000 mg via ORAL
  Filled 2023-09-11: qty 2

## 2023-09-11 MED ORDER — IOHEXOL 300 MG/ML  SOLN
INTRAMUSCULAR | Status: DC | PRN
  Administered 2023-09-11: 10 mL

## 2023-09-11 MED ORDER — OXYBUTYNIN CHLORIDE 5 MG PO TABS: 5.0000 mg | ORAL_TABLET | Freq: Three times a day (TID) | ORAL | 1 refills | Status: AC | PRN

## 2023-09-11 MED ORDER — FENTANYL CITRATE PF 50 MCG/ML IJ SOSY
25.0000 ug | PREFILLED_SYRINGE | INTRAMUSCULAR | Status: DC | PRN
  Administered 2023-09-11: 50 ug via INTRAVENOUS

## 2023-09-11 MED ORDER — FENTANYL CITRATE (PF) 100 MCG/2ML IJ SOLN
INTRAMUSCULAR | Status: AC
  Filled 2023-09-11: qty 2

## 2023-09-11 MED ORDER — CHLORHEXIDINE GLUCONATE 0.12 % MT SOLN
15.0000 mL | Freq: Once | OROMUCOSAL | Status: AC
  Administered 2023-09-11: 15 mL via OROMUCOSAL

## 2023-09-11 MED ORDER — ONDANSETRON HCL 4 MG/2ML IJ SOLN
INTRAMUSCULAR | Status: DC | PRN
  Administered 2023-09-11: 4 mg via INTRAVENOUS

## 2023-09-11 MED ORDER — DEXAMETHASONE SODIUM PHOSPHATE 10 MG/ML IJ SOLN
INTRAMUSCULAR | Status: AC
  Filled 2023-09-11: qty 1

## 2023-09-11 MED ORDER — LACTATED RINGERS IV SOLN: INTRAVENOUS | Status: DC

## 2023-09-11 MED ORDER — LIDOCAINE HCL (PF) 2 % IJ SOLN
INTRAMUSCULAR | Status: DC | PRN
Start: 2023-09-11 — End: 2023-09-11
  Administered 2023-09-11: 100 mg via INTRADERMAL

## 2023-09-11 MED ORDER — EPHEDRINE SULFATE-NACL 50-0.9 MG/10ML-% IV SOSY
PREFILLED_SYRINGE | INTRAVENOUS | Status: DC | PRN
  Administered 2023-09-11 (×3): 5 mg via INTRAVENOUS

## 2023-09-11 MED ORDER — LIDOCAINE HCL (PF) 2 % IJ SOLN
INTRAMUSCULAR | Status: AC
  Filled 2023-09-11: qty 5

## 2023-09-11 MED ORDER — INSULIN ASPART 100 UNIT/ML IJ SOLN
0.0000 [IU] | INTRAMUSCULAR | Status: DC | PRN
  Administered 2023-09-11: 2 [IU] via SUBCUTANEOUS
  Filled 2023-09-11: qty 1

## 2023-09-11 MED ORDER — FENTANYL CITRATE PF 50 MCG/ML IJ SOSY
PREFILLED_SYRINGE | INTRAMUSCULAR | Status: AC
  Filled 2023-09-11: qty 1

## 2023-09-11 MED ORDER — ONDANSETRON HCL 4 MG PO TABS: 4.0000 mg | ORAL_TABLET | Freq: Every day | ORAL | 1 refills | Status: AC | PRN

## 2023-09-11 MED ORDER — OXYCODONE HCL 5 MG PO TABS: 5.0000 mg | ORAL_TABLET | Freq: Once | ORAL | Status: DC | PRN

## 2023-09-11 MED ORDER — HYDROCODONE-ACETAMINOPHEN 5-325 MG PO TABS: 1.0000 | ORAL_TABLET | ORAL | 0 refills | Status: AC | PRN

## 2023-09-11 MED ORDER — MIDAZOLAM HCL 2 MG/2ML IJ SOLN
INTRAMUSCULAR | Status: AC
  Filled 2023-09-11: qty 2

## 2023-09-11 SURGICAL SUPPLY — 18 items
BAG URO CATCHER STRL LF (MISCELLANEOUS) ×1 IMPLANT
BASKET ZERO TIP NITINOL 2.4FR (BASKET) IMPLANT
CATH URETL OPEN 5X70 (CATHETERS) ×1 IMPLANT
CLOTH BEACON ORANGE TIMEOUT ST (SAFETY) ×1 IMPLANT
EXTRACTOR STONE NITINOL NGAGE (UROLOGICAL SUPPLIES) IMPLANT
FIBER LASER FLEXIVA PULSE EA (MISCELLANEOUS) IMPLANT
FIBER LASER MOSES 200 DFL (Laser) IMPLANT
GLOVE SURG LX STRL 8.0 MICRO (GLOVE) ×1 IMPLANT
GOWN STRL SURGICAL XL XLNG (GOWN DISPOSABLE) ×1 IMPLANT
GUIDEWIRE STR DUAL SENSOR (WIRE) IMPLANT
GUIDEWIRE ZIPWRE .038 STRAIGHT (WIRE) ×1 IMPLANT
KIT TURNOVER KIT A (KITS) IMPLANT
MANIFOLD NEPTUNE II (INSTRUMENTS) ×1 IMPLANT
PACK CYSTO (CUSTOM PROCEDURE TRAY) ×1 IMPLANT
SHEATH NAVIGATOR HD 11/13X36 (SHEATH) IMPLANT
STENT URET 6FRX26 CONTOUR (STENTS) IMPLANT
TUBING CONNECTING 10 (TUBING) ×1 IMPLANT
TUBING UROLOGY SET (TUBING) ×1 IMPLANT

## 2023-09-11 NOTE — Anesthesia Postprocedure Evaluation (Signed)
 Anesthesia Post Note  Patient: [name] [name]  Procedure(s) Performed: CYSTOSCOPY/URETEROSCOPY/HOLMIUM LASER/STENT PLACEMENT (Left: Pelvis)     Patient location during evaluation: PACU Anesthesia Type: General Level of consciousness: awake Pain management: pain level controlled Vital Signs Assessment: post-procedure vital signs reviewed and stable Respiratory status: spontaneous breathing, nonlabored ventilation and respiratory function stable Cardiovascular status: blood pressure returned to baseline and stable Postop Assessment: no apparent nausea or vomiting Anesthetic complications: no   No notable events documented.  Last Vitals:  Vitals:   09/11/23 1200 09/11/23 1215  BP: 138/84 (!) 145/95  Pulse: (!) 58 (!) 46  Resp: 16 18  Temp: 36.4 C (!) 36.4 C  SpO2: 100% 99%    Last Pain:  Vitals:   09/11/23 1215  TempSrc:   PainSc: 0-No pain                 Conard Decent

## 2023-09-11 NOTE — Anesthesia Procedure Notes (Signed)
 Procedure Name: Intubation Date/Time: 09/11/2023 9:50 AM  Performed by: Mervyn Ace, CRNAPre-anesthesia Checklist: Patient identified, Emergency Drugs available, Suction available, Patient being monitored and Timeout performed Patient Re-evaluated:Patient Re-evaluated prior to induction Oxygen Delivery Method: Circle system utilized Preoxygenation: Pre-oxygenation with 100% oxygen Induction Type: IV induction and Rapid sequence Laryngoscope Size: Mac and 4 Grade View: Grade I Tube type: Oral Tube size: 8.0 mm Number of attempts: 1 Airway Equipment and Method: Stylet Placement Confirmation: ETT inserted through vocal cords under direct vision, positive ETCO2, CO2 detector and breath sounds checked- equal and bilateral Secured at: 24 cm Tube secured with: Tape (secured with pink Hy-tape) Dental Injury: Teeth and Oropharynx as per pre-operative assessment

## 2023-09-11 NOTE — Transfer of Care (Signed)
 Immediate Anesthesia Transfer of Care Note  Patient: [name] [name]  Procedure(s) Performed: CYSTOSCOPY/URETEROSCOPY/HOLMIUM LASER/STENT PLACEMENT (Left: Pelvis)  Patient Location: PACU  Anesthesia Type:General  Level of Consciousness: awake, alert , oriented, and patient cooperative  Airway & Oxygen Therapy: Patient Spontanous Breathing and Patient connected to face mask oxygen  Post-op Assessment: Report given to RN and Post -op Vital signs reviewed and stable  Post vital signs: Reviewed and stable  Last Vitals:  Vitals Value Taken Time  BP 144/84 09/11/23 1115  Temp 35.9 C 09/11/23 1115  Pulse 66 09/11/23 1121  Resp 15 09/11/23 1121  SpO2 100 % 09/11/23 1121  Vitals shown include unfiled device data.  Last Pain:  Vitals:   09/11/23 1115  TempSrc:   PainSc: 3       Patients Stated Pain Goal: 5 (09/11/23 4098)  Complications: No notable events documented.

## 2023-09-11 NOTE — H&P (Signed)
 Office Visit Report     08/23/2023   --------------------------------------------------------------------------------   [name] [name]  MRN: [id number]  DOB: 1970/06/15, 53 year old Male  PRIMARY CARE:  Wynema Heck, PA  PRIMARY CARE FAX:  [phone]  REFERRING:    PROVIDER:  Ahmad Hotter, M.D.  TREATING:  Yevonne Heman, M.D.  LOCATION:  [facility] [phone]     --------------------------------------------------------------------------------   CC/HPI: Left ureteral stone   [name] [name] is a 53 year old male with an 8 mm proximal left ureteral stone as well as bilateral nonobstructing renal stones that were discovered on CT from 08/14/2023 at [facility]. He has a long history of kidney stones and has required multiple ureteroscopies in the past. He states that his left flank pain has since resolved, but has not seen any stone material pass yet. He denies interval fever/chills, dysuria or gross hematuria.   Hx of a-fib--currently on eliquis --cardiologist: Virgel Griffes, MD     ALLERGIES: Penicillins    MEDICATIONS: Lipitor  metFORMIN  HCl  dilTIAZem  HCl 120 MG Tablet  Eliquis   Metoprolol  Succinate ER     GU PSH: Cysto Uretero Lithotripsy, Right - 2018 Ureteroscopic laser litho, Left - 2018 Ureteroscopic stone removal - 2013       PSH Notes: Cystoscopy With Ureteroscopy With Removal Of Calculus, Knee Surgery, Knee Surgery, kidney stone removal x 2   NON-GU PSH: No Non-GU PSH    GU PMH: Ureteral calculus, He is doing well post op but still has some hematuria. I am going to get blood work today and send his stone for analysis. I will have him do a litholink in about a month and return in 6 weeks with a renal US . He will need a KUB in 6 months. I discussed diet and hydration for stone prevention. - 2018, He has a left proximal and right distal stones with left hydro and pain. I discussed the treatment options and with the bilateral stones, I am going to get him  set up for bilateral ureteroscopy next week. I have reviewed the risks of bleeding, infection, ureteral injury, need for a stent or secondary procedures, thrombotic events and anesthetic complications. , - 2018, Calculus of right ureter, - 2014 Renal calculus, Bilateral kidney stones - 2014 Renal cyst, Renal cyst, acquired, left - 2014      PMH Notes:  1898-04-10 00:00:00 - Note: Normal Routine History And Physical Adult   NON-GU PMH: Personal history of other specified conditions, History of heartburn - 2014 Arthritis Atrial Fibrillation Diabetes Type 2 Gout Hypertension    FAMILY HISTORY: 2 sons - Son Family Health Status Number - Runs In Family Family Health Status Of Father - Alive - [facility] In [facility] Family Health Status Of Mother - Alive 26 - Runs In Family Hypertension - Father Kidney Stones - Grandfather renal cancer - Father   SOCIAL HISTORY: Marital Status: Married Preferred Language: English; Ethnicity: Not Hispanic Or Latino; Race: White Current Smoking Status: Patient has never smoked.   Tobacco Use Assessment Completed: Used Tobacco in last 30 days? Has never drank.  Drinks 4+ caffeinated drinks per day. Patient's occupation Production manager.     Notes: Marital History - Currently Married, Caffeine Use, Never A Smoker, Alcohol Use   REVIEW OF SYSTEMS:    GU Review Male:   Patient denies frequent urination, hard to postpone urination, burning/ pain with urination, get up at night to urinate, leakage of urine, stream starts and stops, trouble starting your stream, have to  strain to urinate , erection problems, and penile pain.  Gastrointestinal (Upper):   Patient denies indigestion/ heartburn, nausea, and vomiting.  Gastrointestinal (Lower):   Patient denies diarrhea and constipation.  Constitutional:   Patient denies fever, night sweats, weight loss, and fatigue.  Skin:   Patient denies skin rash/ lesion and itching.  Eyes:   Patient denies blurred vision and double  vision.  Ears/ Nose/ Throat:   Patient denies sore throat and sinus problems.  Hematologic/Lymphatic:   Patient denies swollen glands and easy bruising.  Cardiovascular:   Patient denies leg swelling and chest pains.  Respiratory:   Patient denies cough and shortness of breath.  Endocrine:   Patient denies excessive thirst.  Musculoskeletal:   Patient denies back pain and joint pain.  Neurological:   Patient denies headaches and dizziness.  Psychologic:   Patient denies depression and anxiety.   VITAL SIGNS:      08/23/2023 11:02 AM  Weight 336 lb / 152.41 kg  Height 76 in / 193.04 cm  BP 123/79 mmHg  Pulse 89 /min  BMI 40.9 kg/m   MULTI-SYSTEM PHYSICAL EXAMINATION:    Constitutional: Well-nourished. No physical deformities. Normally developed. Good grooming. No acute distress  Respiratory: No labored breathing, no use of accessory muscles.   Neurologic / Psychiatric: Oriented to time, oriented to place, oriented to person. No depression, no anxiety, no agitation.  Gastrointestinal: Obese abdomen. No mass, no tenderness, no rigidity.      Complexity of Data:  Records Review:   Previous Patient Records  X-Ray Review: C.T. Abdomen/Pelvis: Reviewed Report. Discussed With Patient.    Notes:                     Melanie Spires, MD - 08/14/2023  Formatting of this note might be different from the original.  Exam: Renal Colic CT   History: Left flank pain, microscopic hematuria   Technique: CT of the abdomen and pelvis without IV contrast. Renal colic protocol performed prone. This examination is performed without oral or IV contrast in order to specifically assess the urinary tract for the presence of an obstructing ureteral calculus. Assessment of the bowel and of the solid organs of the abdomen and pelvis is compromised as a consequence. AEC (automated exposure control) and/or manual techniques such as size-specific kV and mAs are employed where appropriate to reduce  radiation exposure for all CT exams.   Comparison: CT abdomen pelvis 12/11/2020   Abdomen and Pelvis CT Findings:   KIDNEYS/URETERS: Moderate left hydronephrosis with a 8 x 7 mm stone in the proximal ureter. 6 mm nonobstructing stone in the left mid kidney. 8 mm stone in the lower left kidney. 4 mm stone in the lower left kidney. 2 mm stone in the upper right kidney. Punctate stone lower right kidney. Bilateral renal cysts, which do not require follow-up.  Mild haziness of the left perinephric and periureteral fat.   BLADDER: Normal.   LOWER THORAX: The lung bases are clear.   HEPATOBILIARY: No abnormal findings.   PANCREAS: Normal.   SPLEEN: Normal.   ADRENALS: Normal.   VASCULAR: The aorta is not dilated.   LYMPH NODES: No adenopathy.   BOWEL/MESENTERY: No abnormal bowel dilation. Normal appendix. Colonic diverticula.   PELVIC ORGANS: No pelvic mass, abscess, adenopathy or inflammatory change.   BONES/SOFT TISSUES: Degenerative changes of the spine.   IMPRESSION:  Moderate left hydronephrosis with a 8 mm stone in the proximal ureter.  Bilateral nonobstructing renal stones.  Signed (Electronic Signature): 08/14/2023 11:00 AM  Signed By: Micheal Agent, MD   Exam End: 08/14/23 08:58      PROCEDURES:         KUB - 81191  A single view of the abdomen is obtained.      Patient confirmed No Neulasta OnPro Device.   KUB today shows a persistent calcification along the expected course of the proximal left ureter along with multiple calcifications within the lower portion of the left renal shadow. There are no visible calcifications seen within the right renal shadow, along the expected course of the right ureter or within the bladder.          Renal Ultrasound - [id number]  Right Kidney: Length: 12.6 cm Depth: 6.6 cm Cortical Width: 2.1 cm Width: 5.3 cm  Left Kidney: Length: 13.8 cm Depth: 8.9 cm Cortical Width: 1.9 cm Width: 6.0 cm  Left Kidney/Ureter:  Mild  hydro. Multiple Calcifications. Complex Cyst UP.  Right Kidney/Ureter:  Multiple Calcifications.  Bladder:  PVR 3.72 ml      Patient confirmed No Neulasta OnPro Device.   Mild hydronephrosis seen involving the left kidney. There is no hydronephrosis involving the right kidney. No evidence of solid parenchymal lesions involving either kidney. Bladder with smooth contour with no internal debris         Urinalysis w/Scope Dipstick Dipstick Cont'd Micro  Color: Amber Bilirubin: Neg mg/dL WBC/hpf: 6 - 56/OZH  Appearance: Slightly Cloudy Ketones: Neg mg/dL RBC/hpf: 3 - 08/MVH  Specific Gravity: 1.025 Blood: 3+ ery/uL Bacteria: Rare (0-9/hpf)  pH: 5.5 Protein: Trace mg/dL Cystals: NS (Not Seen)  Glucose: Neg mg/dL Urobilinogen: 0.2 mg/dL Casts: NS (Not Seen)    Nitrites: Neg Trichomonas: Not Present    Leukocyte Esterase: Neg leu/uL Mucous: Not Present      Epithelial Cells: NS (Not Seen)      Yeast: NS (Not Seen)      Sperm: Not Present    ASSESSMENT:      ICD-10 Details  1 GU:   Renal and ureteral calculus - N20.2 Left, Undiagnosed New Problem  2   Ureteral obstruction secondary to calculous - N13.2 Left, Undiagnosed New Problem   PLAN:            Medications New Meds: Tamsulosin HCl 0.4 MG Capsule 1 capsule PO Daily   #30  0 Refill(s)  Pharmacy Name:  [facility] [phone]  Address:  [address]   [place], [place] [address]  Phone:  [phone]  Fax:  [phone]            Orders X-Rays: Renal Ultrasound    KUB          Schedule Labs: 2 Weeks - BMP  X-Rays: 2 Weeks - Renal Ultrasound    2 Weeks - KUB  Return Visit/Planned Activity: 2 Weeks - Office Visit, Extender          Document Letter(s):  Created for Patient: Clinical Summary         Notes:    - KUB today shows a persistent calcification consistent with the 8 mm proximal left ureteral stone seen on recent CT. He also has multiple nonobstructing left renal stones. No right renal stone burden can be  identified.  - Renal ultrasound today shows mild hydronephrosis involving the left kidney  - We extensively discussed the option of ureteroscopy versus medical expulsive therapy. He would like to continue with observation at this time. We discussed criteria to return to  clinic or proceed to the ER which include: Fever/chills, worsening pain, nausea/vomiting and/or persistent gross hematuria.  - Plan for follow-up renal ultrasound, KUB and BMP in 2 weeks.   The risks, benefits and alternatives of cystoscopy with LEFT ureteroscopy, laser lithotripsy and ureteral stent placement was discussed the patient.  Risks included, but are not limited to: bleeding, urinary tract infection, ureteral injury/avulsion, ureteral stricture formation, retained stone fragments, the possibility that multiple surgeries may be required to treat the stone(s), MI, stroke, PE and the inherent risks of general anesthesia.  The patient voices understanding and wishes to proceed.

## 2023-09-11 NOTE — Op Note (Signed)
 Operative Note  Preoperative diagnosis:  1.  8 mm proximal left ureteral stone  Postoperative diagnosis: 1.  Obstructing and impacted 8 mm proximal left ureteral stone  Procedure(s): 1.  Cystoscopy with left ureteroscopy, holmium laser lithotripsy and left JJ stent placement 2.  Left retrograde pyelogram with intraoperative interpretation fluoroscopic imaging  Surgeon: Yevonne Heman, MD  Assistants:  None  Anesthesia:  General  Complications:  None  EBL: Less than 5 mL  Specimens: 1.  Left ureteral stone fragments  Drains/Catheters: 1.  Left 6 French, 26 cm JJ stent without tether  Intraoperative findings:   No intravesical or urethral abnormalities were seen. Left retrograde pyelogram revealed a normal caliber distal ureter with no filling defects.  There was a large filling defect within the mid the proximal aspects of the left ureter consistent with the obstructing stone seen on cross-sectional imaging. Obstructing and impacted left mid to proximal ureteral stone No additional stone burden was seen within the left renal pelvis  Indication:  [name] [name] is a 53 y.o. male with left renal colic secondary to an obstructing 8 mm left ureteral stone.  He has been consented for the above procedures, voices understanding and wishes to proceed.  Description of procedure:  After informed consent was obtained, the patient was brought to the operating room and general LMA anesthesia was administered. The patient was then placed in the dorsolithotomy position and prepped and draped in the usual sterile fashion. A timeout was performed. A 21 French rigid cystoscope was then inserted into the urethral meatus and advanced into the bladder under direct vision. A complete bladder survey revealed no intravesical pathology.  A 5 French ureteral catheter was then inserted into the left ureteral orifice and a retrograde pyelogram was obtained, with the findings listed above.  A  Glidewire was then used to intubate the lumen of the ureteral catheter and was advanced up to the left renal pelvis, under fluoroscopic guidance.  The catheter was then removed, leaving the wire in place.  A semirigid ureteroscope was then advanced into the urethra and into the bladder.  The semirigid ureteroscope was then advanced up the left ureter where his obstructing stone was identified.  A 200 m holmium laser was then used to fracture the stone into numerous smaller pieces.  A 0 tip basket was then used to extract all stone fragments from the lumen of the left ureter.  The semirigid ureteroscope was then removed under direct vision.  A flexible ureteroscope was then advanced alongside the Glidewire and up to the left renal pelvis where a full inspection revealed no additional stone burden.  The flexible ureteroscope was then removed and replaced with a rigid cystoscope.  A 6 French, 26 cm JJ stent was then advanced over the Glidewire and into good position within the left collecting system, confirming placement via fluoroscopy.  The patient's bladder was drained.  He tolerated the procedure well and was transferred to the postanesthesia in stable condition.  Plan: Follow-up in 1 week for office cystoscopy and stent removal

## 2023-09-12 ENCOUNTER — Encounter (HOSPITAL_COMMUNITY): Payer: Self-pay | Admitting: Urology

## 2023-09-23 ENCOUNTER — Encounter: Payer: Self-pay | Admitting: Emergency Medicine

## 2023-09-23 ENCOUNTER — Ambulatory Visit
Admission: EM | Admit: 2023-09-23 | Discharge: 2023-09-23 | Disposition: A | Discharge status: Home / Self Care | Attending: Family Medicine | Admitting: Family Medicine

## 2023-09-23 ENCOUNTER — Other Ambulatory Visit: Payer: Self-pay

## 2023-09-23 DIAGNOSIS — R310 Gross hematuria: Secondary | ICD-10-CM | POA: Diagnosis not present

## 2023-09-23 DIAGNOSIS — R109 Unspecified abdominal pain: Secondary | ICD-10-CM

## 2023-09-23 DIAGNOSIS — Z87442 Personal history of urinary calculi: Secondary | ICD-10-CM | POA: Diagnosis not present

## 2023-09-23 LAB — POCT URINALYSIS DIP (MANUAL ENTRY)
Bilirubin, UA: NEGATIVE
Glucose, UA: NEGATIVE mg/dL
Ketones, POC UA: NEGATIVE mg/dL
Leukocytes, UA: NEGATIVE
Nitrite, UA: NEGATIVE
Spec Grav, UA: 1.025 (ref 1.010–1.025)
Urobilinogen, UA: 0.2 U/dL
pH, UA: 5.5 (ref 5.0–8.0)

## 2023-09-23 NOTE — ED Triage Notes (Addendum)
 Pt reports hematuria, urinary frequency, left flank pain since last night. Reports had kidney procedure on 6/3, stent removal on 6/11. Denies abd pain, nausea, emesis, or known fevers.

## 2023-09-23 NOTE — Discharge Instructions (Signed)
 Take Flomax daily, drink plenty of fluids and follow-up with urologist first thing tomorrow morning.  Follow-up sooner if worsening at any time.

## 2023-09-27 NOTE — ED Provider Notes (Signed)
 [facility]    CSN: [id number] Arrival date & time: 09/23/23  0808      History   Chief Complaint Chief Complaint  Patient presents with   Hematuria    HPI [name] [name] is a 53 y.o. male.   Patient presenting today with 1 day history of hematuria, urinary frequency, left flank pain.  States he had a stent placed in his left ureter on 09/11/2023 and removed on 09/19/2023 due to significant kidney stones.  States he has been feeling well postprocedure until last night.  Denies abdominal pain, nausea, vomiting, fevers, chills, body aches, bowel changes.  So far not trying anything over-the-counter for symptoms.    Past Medical History:  Diagnosis Date   Arthritis    Dysrhythmia    A.fib   GERD (gastroesophageal reflux disease)    Gout    History of kidney stones    History of pneumonia    Hypertension    Paroxysmal atrial fibrillation (HCC)    Pneumonia    Sleep apnea    no CPAP   Type 2 diabetes mellitus (HCC)     Patient Active Problem List   Diagnosis Date Noted   Unilateral primary osteoarthritis, left knee 12/06/2022   Joint pain 11/29/2020   Atrial fibrillation with RVR (HCC) 11/28/2020   A-fib (HCC) 11/28/2020   HLD (hyperlipidemia) 11/28/2020    Class: Chronic   DMII (diabetes mellitus, type 2) (HCC) 11/28/2020    Class: Chronic   Sinus tachycardia 11/12/2020   Dehydration 11/12/2020   Status post right knee replacement 10/29/2020   Anginal equivalent (HCC) 09/14/2020   Morbid (severe) obesity due to excess calories (HCC) 12/18/2019   Essential (primary) hypertension 06/26/2019    Class: Chronic   DOE (dyspnea on exertion) 06/26/2019   History of arthroscopy of knee 03/11/2018   Primary localized osteoarthrosis of right lower leg 02/07/2018   Meniscal cyst, right 11/08/2016    Past Surgical History:  Procedure Laterality Date   CYSTOSCOPY/RETROGRADE/URETEROSCOPY/STONE EXTRACTION WITH BASKET Bilateral 08/29/2016   Procedure:  CYSTOSCOPY/BILATERAL RETEROGRADE BILATERTAL URETEROSCOPY/STONE EXTRACTION WITH BASKET, RIGHT STENT PLACEMENT, HOLIUM LASER;  Surgeon: Homero Luster, MD;  Location: [facility];  Service: Urology;  Laterality: Bilateral;   CYSTOSCOPY/URETEROSCOPY/HOLMIUM LASER/STENT PLACEMENT Left 09/11/2023   Procedure: CYSTOSCOPY/URETEROSCOPY/HOLMIUM LASER/STENT PLACEMENT;  Surgeon: Adelbert Homans, MD;  Location: [facility];  Service: Urology;  Laterality: Left;  CYSTOSCOPY/LEFT URETEROSCOPY/HOLMIUM LASER/STENT PLACEMENT/ RETROGRADE PYELOGRAM   KNEE SURGERY Right    x 5   STONE EXTRACTION WITH BASKET     STRABISMUS SURGERY Right 04/02/2015   Procedure: REPAIR STRABISMUS RIGHT EYE;  Surgeon: Dorothey Gate, MD;  Location: [facility];  Service: Ophthalmology;  Laterality: Right;   TOTAL KNEE ARTHROPLASTY Right 10/29/2020   Procedure: RIGHT TOTAL KNEE ARTHROPLASTY;  Surgeon: Arnie Lao, MD;  Location: [facility];  Service: Orthopedics;  Laterality: Right;       Home Medications    Prior to Admission medications   Medication Sig Start Date End Date Taking? Authorizing Provider  acetaminophen  (TYLENOL ) 650 MG CR tablet Take 650 mg by mouth every 8 (eight) hours as needed for pain.    [provider]  apixaban  (ELIQUIS ) 5 MG TABS tablet TAKE 1 TABLET BY MOUTH TWICE A DAY 06/21/23   Gerard Knight, MD  atorvastatin  (LIPITOR) 20 MG tablet Take 20 mg by mouth daily.    [provider]  CONTOUR NEXT TEST test strip USE TO test TWICE DAILY 01/27/21   [provider]  diltiazem  (CARDIZEM  CD) 120 MG 24 hr capsule Take 1 capsule (120 mg total) by mouth daily. 02/09/21   Gerard Knight, MD  diltiazem  (CARDIZEM ) 30 MG tablet Take 1 tablet every 4 hours AS NEEDED for heart rate >100. 11/23/22   Gerard Knight, MD  fluticasone (FLONASE) 50 MCG/ACT nasal spray Place 1 spray into both nostrils 2 (two) times daily as needed for allergies or rhinitis.    [provider]  HYDROcodone -acetaminophen  (NORCO/VICODIN) 5-325 MG tablet Take 1 tablet by mouth every 4 (four) hours as needed for severe pain (pain score 7-10). 09/11/23   Adelbert Homans, MD  metFORMIN  (GLUCOPHAGE -XR) 500 MG 24 hr tablet Take 1,500 mg by mouth every evening. 07/11/23   [provider]  metoprolol  succinate (TOPROL -XL) 100 MG 24 hr tablet Take 1 tablet (100 mg total) by mouth daily. Take with or immediately following a meal. 11/30/20 08/02/24  Shahmehdi, Constantino Demark, MD  ondansetron  (ZOFRAN ) 4 MG tablet Take 1 tablet (4 mg total) by mouth daily as needed for nausea or vomiting. 09/11/23 09/10/24  Adelbert Homans, MD  oxybutynin  (DITROPAN ) 5 MG tablet Take 1 tablet (5 mg total) by mouth every 8 (eight) hours as needed for bladder spasms. 09/11/23   Adelbert Homans, MD  phenazopyridine  (PYRIDIUM ) 200 MG tablet Take 1 tablet (200 mg total) by mouth 3 (three) times daily as needed (for pain with urination). 09/11/23 09/10/24  Adelbert Homans, MD    Family History Family History  Problem Relation Age of Onset   Hypertension Father    Cancer Father    Cancer Maternal Grandmother    Cancer Maternal Grandfather    Cancer Paternal Grandmother    Heart failure Paternal Grandmother     Social History Social History   Tobacco Use   Smoking status: Never    Passive exposure: Never   Smokeless tobacco: Never  Vaping Use   Vaping status: Never Used  Substance Use Topics   Alcohol use: No   Drug use: No     Allergies   Penicillins   Review of Systems Review of Systems Per HPI  Physical Exam Triage Vital Signs ED Triage Vitals  Encounter Vitals Group     BP 09/23/23 0833 (!) 133/92     Girls Systolic BP Percentile --      Girls Diastolic BP Percentile --      Boys Systolic BP Percentile --      Boys Diastolic BP Percentile --      Pulse Rate 09/23/23 0833 (!) 52     Resp 09/23/23 0833 20     Temp 09/23/23 0833 98.5 F (36.9 C)      Temp Source 09/23/23 0833 Oral     SpO2 09/23/23 0833 97 %     Weight --      Height --      Head Circumference --      Peak Flow --      Pain Score 09/23/23 0832 2     Pain Loc --      Pain Education --      Exclude from Growth Chart --    No data found.  Updated Vital Signs BP (!) 133/92 (BP Location: Right Arm)   Pulse (!) 52   Temp 98.5 F (36.9 C) (Oral)   Resp 20   SpO2 97%   Visual Acuity Right Eye Distance:   Left Eye Distance:   Bilateral Distance:  Right Eye Near:   Left Eye Near:    Bilateral Near:     Physical Exam Vitals and nursing note reviewed.  Constitutional:      Appearance: Normal appearance.  HENT:     Head: Atraumatic.   Eyes:     Extraocular Movements: Extraocular movements intact.     Conjunctiva/sclera: Conjunctivae normal.    Cardiovascular:     Rate and Rhythm: Normal rate.  Pulmonary:     Effort: Pulmonary effort is normal.  Abdominal:     General: Abdomen is flat. There is no distension.     Tenderness: There is no abdominal tenderness. There is no guarding.   Musculoskeletal:        General: Normal range of motion.     Cervical back: Normal range of motion and neck supple.   Skin:    General: Skin is warm and dry.   Neurological:     General: No focal deficit present.     Mental Status: He is oriented to person, place, and time.   Psychiatric:        Mood and Affect: Mood normal.        Thought Content: Thought content normal.        Judgment: Judgment normal.      UC Treatments / Results  Labs (all labs ordered are listed, but only abnormal results are displayed) Labs Reviewed  POCT URINALYSIS DIP (MANUAL ENTRY) - Abnormal; Notable for the following components:      Result Value   Clarity, UA cloudy (*)    Blood, UA large (*)    Protein Ur, POC trace (*)    All other components within normal limits    EKG   Radiology No results found.  Procedures Procedures (including critical care  time)  Medications Ordered in UC Medications - No data to display  Initial Impression / Assessment and Plan / UC Course  I have reviewed the triage vital signs and the nursing notes.  Pertinent labs & imaging results that were available during my care of the patient were reviewed by me and considered in my medical decision making (see chart for details).     Large blood but no leuks or nitrites on urinalysis today.  He is well-appearing in no acute distress with stable vital signs.  Discussed no evidence of a urinary tract infection today and recommended follow-up with urologist for further evaluation.  ED for significantly worsening symptoms.  He states he has Flomax at home from prior kidney stone episodes, recommended starting to take daily until able to speak with urologist.  Final Clinical Impressions(s) / UC Diagnoses   Final diagnoses:  Left flank pain  Gross hematuria  History of kidney stones     Discharge Instructions      Take Flomax daily, drink plenty of fluids and follow-up with urologist first thing tomorrow morning.  Follow-up sooner if worsening at any time.    ED Prescriptions   None    PDMP not reviewed this encounter.   Corbin Dess, [place] 09/27/23 1449

## 2023-12-26 ENCOUNTER — Other Ambulatory Visit: Payer: Self-pay | Admitting: Cardiology

## 2023-12-26 DIAGNOSIS — I480 Paroxysmal atrial fibrillation: Secondary | ICD-10-CM

## 2023-12-26 NOTE — Telephone Encounter (Signed)
 Prescription refill request for Eliquis  received. Indication: PAF Last office visit: 08/03/23  GORMAN Sierras MD Scr: 1.27 on 09/05/23  Epic Age: 53 Weight: 158.1kg  Based on above findings Eliquis  5mg  twice daily is the appropriate dose.  Refill approved.
